# Patient Record
Sex: Male | Born: 1970 | Race: White | Hispanic: No | Marital: Married | Smoking: Current some day smoker
Health system: Southern US, Community
[De-identification: ages and names within clinical notes are randomized; demographics above are authoritative.]

## PROBLEM LIST (undated history)

## (undated) DIAGNOSIS — R51 Headache: Secondary | ICD-10-CM

## (undated) DIAGNOSIS — R079 Chest pain, unspecified: Secondary | ICD-10-CM

## (undated) DIAGNOSIS — I10 Essential (primary) hypertension: Secondary | ICD-10-CM

## (undated) DIAGNOSIS — E785 Hyperlipidemia, unspecified: Secondary | ICD-10-CM

## (undated) DIAGNOSIS — R519 Headache, unspecified: Secondary | ICD-10-CM

## (undated) DIAGNOSIS — I739 Peripheral vascular disease, unspecified: Secondary | ICD-10-CM

## (undated) HISTORY — DX: Hyperlipidemia, unspecified: E78.5

## (undated) HISTORY — PX: FINGER TENDON REPAIR: SHX1640

---

## 2015-01-24 ENCOUNTER — Ambulatory Visit (HOSPITAL_COMMUNITY)
Admission: RE | Admit: 2015-01-24 | Discharge: 2015-01-24 | Disposition: A | Discharge status: Home / Self Care | Payer: BLUE CROSS/BLUE SHIELD | Source: Ambulatory Visit | Attending: Vascular Surgery | Admitting: Vascular Surgery

## 2015-01-24 ENCOUNTER — Other Ambulatory Visit (HOSPITAL_COMMUNITY): Payer: Self-pay | Admitting: *Deleted

## 2015-01-24 DIAGNOSIS — I70212 Atherosclerosis of native arteries of extremities with intermittent claudication, left leg: Secondary | ICD-10-CM | POA: Diagnosis not present

## 2015-02-02 ENCOUNTER — Encounter: Payer: Self-pay | Admitting: Vascular Surgery

## 2015-02-07 ENCOUNTER — Other Ambulatory Visit: Payer: Self-pay

## 2015-02-07 ENCOUNTER — Ambulatory Visit (INDEPENDENT_AMBULATORY_CARE_PROVIDER_SITE_OTHER): Payer: BLUE CROSS/BLUE SHIELD | Admitting: Vascular Surgery

## 2015-02-07 ENCOUNTER — Encounter: Payer: Self-pay | Admitting: Vascular Surgery

## 2015-02-07 VITALS — BP 137/90 | HR 87 | Ht 73.0 in | Wt 241.0 lb

## 2015-02-07 DIAGNOSIS — I739 Peripheral vascular disease, unspecified: Secondary | ICD-10-CM

## 2015-02-07 NOTE — Progress Notes (Signed)
HISTORY AND PHYSICAL     CC:  Pain in left leg Referring Provider:  Harlan Stainsoe, Lori, MD  HPI: This is a 44 y.o. 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  He states that at the end of last year, he started noticing some discomfort in his left leg.  Over the past couple of months, this has progressed.  He states that he can walk about 50-60 yards when his calf starts to cramp.  This is relieved with rest.  He states that after being on his feet for a while, he gets soreness in the ball of his foot and tingling in his toes.   He does not recall any thigh or buttock claudication.  He does smoke about 1ppd of cigarettes for 25 years.  He and his wife are both wearing patches and wanting to quit smoking.  He states that he has had some chest pressure with the latest episode this morning and it lasted about 5 minutes.  He had a couple of episodes last week as well.  His dad died of MI in his 2760's.    He is on an ARB for blood pressure control.  No past medical history on file.  No past surgical history on file.  No Known Allergies  Current Outpatient Prescriptions  Medication Sig Dispense Refill  . losartan-hydrochlorothiazide (HYZAAR) 100-25 MG tablet TK 1 T PO D  11   No current facility-administered medications for this visit.    No family history on file.  Social History   Social History  . Marital Status: Unknown    Spouse Name: N/A  . Number of Children: N/A  . Years of Education: N/A   Occupational History  . Not on file.   Social History Main Topics  . Smoking status: Current Every Day Smoker    Types: Cigarettes  . Smokeless tobacco: Not on file     Comment: started today using nicotine patches, stated he has only smoked 2 cigs today  . Alcohol Use: 3.0 oz/week    5 Shots of liquor per week  . Drug Use: No  . Sexual Activity: Not on file   Other Topics Concern  . Not on file   Social History  Narrative  . No narrative on file     ROS: [x]  Positive   [ ]  Negative   [ ]  All sytems reviewed and are negative  Cardiovascular: [x]  chest pain/pressure []  palpitations []  SOB lying flat []  DOE [x]  pain in legs while walking [x]  pain in feet []  hx of DVT []  hx of phlebitis []  swelling in legs []  varicose veins  Pulmonary: []  productive cough []  asthma []  wheezing  Neurologic: []  weakness in []  arms []  legs []  numbness in []  arms []  legs [] difficulty speaking or slurred speech []  temporary loss of vision in one eye []  dizziness  Hematologic: []  bleeding problems []  problems with blood clotting easily  GI []  vomiting blood []  blood in stool  GU: []  burning with urination []  blood in urine  Psychiatric: []  hx of major depression  Integumentary: []  rashes []  ulcers  Constitutional: []  fever []  chills   PHYSICAL EXAMINATION:  Filed Vitals:   02/07/15 1536  BP: 137/90  Pulse: 87   Body mass index is 31.8 kg/(m^2).  General:  WDWN in NAD Gait: Normal HENT: WNL, normocephalic Pulmonary: normal non-labored breathing , without Rales, rhonchi,  wheezing Cardiac: RRR, without  Murmurs, rubs or gallops; with left carotid bruit Abdomen: soft, NT, no masses Skin: without rashes Vascular Exam/Pulses:  Right Left  Radial 2+ (normal) 2+ (normal)  Ulnar 2+ (normal) 2+ (normal)  Femoral 2+ (normal) 1+ (weak)  Popliteal Unable to palpate  Unable to palpate   DP 2+ (normal) absent  PT 2+ (normal) absent   Extremities: without ischemic changes, without Gangrene , without cellulitis; without open wounds;  Musculoskeletal: no muscle wasting or atrophy  Neurologic: A&O X 3; Appropriate Affect ; SENSATION: normal; MOTOR FUNCTION:  moving all extremities equally. Speech is fluent/normal   Non-Invasive Vascular Imaging:   ABI's 01/24/15: Right:  0.78 Left:  0.54  TBI'S 01/24/15: Right:  >0.70 Left:  <0.69  Pt meds includes: Statin:  No. Beta  Blocker:  No. Aspirin:  No. ACEI:  No. ARB:  Yes.   Other Antiplatelet/Anticoagulant:  No.    ASSESSMENT/PLAN:: 44 y.o. male with lifestyle limiting claudication   -pt has had progressive worsening of his left leg claudication and recent ABI's on the left are about 55%.  He does not have any non healing wounds. -will plan for aortogram with BLE runoff and possible intervention tomorrow. -He does need a cardiac evaluation given his family hx and his recent chest pressure.  If he needs surgery, he will need this workup prior to surgery.  -possible left carotid bruit-will need a carotid duplex in the near future -discussed with he and his wife the importance of quitting smoking.  The 1-800-QUIT-NOW number is given to them.   They are both wearing patches to aid them in smoking cessation.   Doreatha Massed, PA-C [facility] [phone]  Clinic MD:  Pt seen and examined in conjunction with Dr. Arbie Cookey  I have examined the patient, reviewed and agree with above. Limiting claudication. Will proceed with arteriogram and possible angioplasty. Will need cardiac evaluation and clearance if surgery is required.  Gretta Began, MD 02/07/2015 4:38 PM

## 2015-02-08 ENCOUNTER — Encounter (HOSPITAL_COMMUNITY)
Admission: RE | Disposition: A | Discharge status: Home / Self Care | Payer: Self-pay | Source: Ambulatory Visit | Attending: Vascular Surgery

## 2015-02-08 ENCOUNTER — Ambulatory Visit (HOSPITAL_COMMUNITY)
Admission: RE | Admit: 2015-02-08 | Discharge: 2015-02-08 | Disposition: A | Discharge status: Home / Self Care | Payer: BLUE CROSS/BLUE SHIELD | Source: Ambulatory Visit | Attending: Vascular Surgery | Admitting: Vascular Surgery

## 2015-02-08 ENCOUNTER — Other Ambulatory Visit: Payer: Self-pay | Admitting: Physician Assistant

## 2015-02-08 DIAGNOSIS — Z8249 Family history of ischemic heart disease and other diseases of the circulatory system: Secondary | ICD-10-CM | POA: Insufficient documentation

## 2015-02-08 DIAGNOSIS — R0789 Other chest pain: Secondary | ICD-10-CM | POA: Insufficient documentation

## 2015-02-08 DIAGNOSIS — F17210 Nicotine dependence, cigarettes, uncomplicated: Secondary | ICD-10-CM | POA: Diagnosis not present

## 2015-02-08 DIAGNOSIS — E669 Obesity, unspecified: Secondary | ICD-10-CM | POA: Diagnosis not present

## 2015-02-08 DIAGNOSIS — I70213 Atherosclerosis of native arteries of extremities with intermittent claudication, bilateral legs: Secondary | ICD-10-CM | POA: Insufficient documentation

## 2015-02-08 DIAGNOSIS — I70212 Atherosclerosis of native arteries of extremities with intermittent claudication, left leg: Secondary | ICD-10-CM | POA: Diagnosis not present

## 2015-02-08 DIAGNOSIS — I739 Peripheral vascular disease, unspecified: Secondary | ICD-10-CM

## 2015-02-08 DIAGNOSIS — Z720 Tobacco use: Secondary | ICD-10-CM

## 2015-02-08 DIAGNOSIS — Z01810 Encounter for preprocedural cardiovascular examination: Secondary | ICD-10-CM | POA: Diagnosis not present

## 2015-02-08 DIAGNOSIS — F17200 Nicotine dependence, unspecified, uncomplicated: Secondary | ICD-10-CM | POA: Insufficient documentation

## 2015-02-08 HISTORY — PX: PERIPHERAL VASCULAR CATHETERIZATION: SHX172C

## 2015-02-08 LAB — POCT I-STAT, CHEM 8
BUN: 16 mg/dL (ref 6–20)
CALCIUM ION: 1.06 mmol/L — AB (ref 1.12–1.23)
CREATININE: 0.9 mg/dL (ref 0.61–1.24)
Chloride: 98 mmol/L — ABNORMAL LOW (ref 101–111)
GLUCOSE: 95 mg/dL (ref 65–99)
HCT: 53 % — ABNORMAL HIGH (ref 39.0–52.0)
HEMOGLOBIN: 18 g/dL — AB (ref 13.0–17.0)
Potassium: 3.7 mmol/L (ref 3.5–5.1)
Sodium: 137 mmol/L (ref 135–145)
TCO2: 27 mmol/L (ref 0–100)

## 2015-02-08 SURGERY — ABDOMINAL AORTOGRAM
Anesthesia: LOCAL

## 2015-02-08 MED ORDER — LIDOCAINE HCL (PF) 1 % IJ SOLN
INTRAMUSCULAR | Status: DC | PRN
  Administered 2015-02-08: 15 mL

## 2015-02-08 MED ORDER — ACETAMINOPHEN 325 MG RE SUPP: 325.0000 mg | RECTAL | Status: DC | PRN

## 2015-02-08 MED ORDER — SODIUM CHLORIDE 0.9 % IV SOLN
INTRAVENOUS | Status: DC
  Administered 2015-02-08: 11:00:00 via INTRAVENOUS

## 2015-02-08 MED ORDER — SODIUM CHLORIDE 0.9 % IV SOLN: INTRAVENOUS | Status: DC

## 2015-02-08 MED ORDER — ACETAMINOPHEN 325 MG PO TABS: 325.0000 mg | ORAL_TABLET | ORAL | Status: DC | PRN

## 2015-02-08 MED ORDER — METOPROLOL TARTRATE 1 MG/ML IV SOLN: 2.0000 mg | INTRAVENOUS | Status: DC | PRN

## 2015-02-08 MED ORDER — HYDRALAZINE HCL 20 MG/ML IJ SOLN: 5.0000 mg | INTRAMUSCULAR | Status: DC | PRN

## 2015-02-08 MED ORDER — PHENOL 1.4 % MT LIQD: 1.0000 | OROMUCOSAL | Status: DC | PRN

## 2015-02-08 MED ORDER — SODIUM CHLORIDE 0.9 % IV SOLN: 500.0000 mL | Freq: Once | INTRAVENOUS | Status: DC | PRN

## 2015-02-08 MED ORDER — OXYCODONE HCL 5 MG PO TABS: 5.0000 mg | ORAL_TABLET | ORAL | Status: DC | PRN

## 2015-02-08 MED ORDER — ALUM & MAG HYDROXIDE-SIMETH 200-200-20 MG/5ML PO SUSP: 15.0000 mL | ORAL | Status: DC | PRN

## 2015-02-08 MED ORDER — MIDAZOLAM HCL 2 MG/2ML IJ SOLN
INTRAMUSCULAR | Status: AC
  Filled 2015-02-08: qty 4

## 2015-02-08 MED ORDER — LABETALOL HCL 5 MG/ML IV SOLN: 10.0000 mg | INTRAVENOUS | Status: DC | PRN

## 2015-02-08 MED ORDER — ONDANSETRON HCL 4 MG/2ML IJ SOLN: 4.0000 mg | Freq: Four times a day (QID) | INTRAMUSCULAR | Status: DC | PRN

## 2015-02-08 MED ORDER — PANTOPRAZOLE SODIUM 40 MG PO TBEC: 40.0000 mg | DELAYED_RELEASE_TABLET | Freq: Every day | ORAL | Status: DC

## 2015-02-08 MED ORDER — MORPHINE SULFATE (PF) 10 MG/ML IV SOLN: 2.0000 mg | INTRAVENOUS | Status: DC | PRN

## 2015-02-08 MED ORDER — HEPARIN (PORCINE) IN NACL 2-0.9 UNIT/ML-% IJ SOLN
INTRAMUSCULAR | Status: AC
  Filled 2015-02-08: qty 1000

## 2015-02-08 MED ORDER — GUAIFENESIN-DM 100-10 MG/5ML PO SYRP: 15.0000 mL | ORAL_SOLUTION | ORAL | Status: DC | PRN

## 2015-02-08 MED ORDER — LIDOCAINE HCL (PF) 1 % IJ SOLN
INTRAMUSCULAR | Status: AC
  Filled 2015-02-08: qty 30

## 2015-02-08 MED ORDER — DOCUSATE SODIUM 100 MG PO CAPS: 100.0000 mg | ORAL_CAPSULE | Freq: Every day | ORAL | Status: DC

## 2015-02-08 SURGICAL SUPPLY — 10 items
BAG SNAP BAND KOVER 36X36 (MISCELLANEOUS) ×2 IMPLANT
CATH ANGIO 5F PIGTAIL 65CM (CATHETERS) ×2 IMPLANT
COVER PRB 48X5XTLSCP FOLD TPE (BAG) ×1 IMPLANT
COVER PROBE 5X48 (BAG) ×1
KIT PV (KITS) ×2 IMPLANT
SHEATH PINNACLE 5F 10CM (SHEATH) ×2 IMPLANT
SYR MEDRAD MARK V 150ML (SYRINGE) ×2 IMPLANT
TRANSDUCER W/STOPCOCK (MISCELLANEOUS) ×2 IMPLANT
TRAY PV CATH (CUSTOM PROCEDURE TRAY) ×2 IMPLANT
WIRE HI TORQ VERSACORE-J 145CM (WIRE) ×2 IMPLANT

## 2015-02-08 NOTE — H&P (View-Only) (Signed)
HISTORY AND PHYSICAL     CC:  Pain in left leg Referring Provider:  Harlan Stainsoe, Lori, MD  HPI: This is a 44 y.o. 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  He states that at the end of last year, he started noticing some discomfort in his left leg.  Over the past couple of months, this has progressed.  He states that he can walk about 50-60 yards when his calf starts to cramp.  This is relieved with rest.  He states that after being on his feet for a while, he gets soreness in the ball of his foot and tingling in his toes.   He does not recall any thigh or buttock claudication.  He does smoke about 1ppd of cigarettes for 25 years.  He and his wife are both wearing patches and wanting to quit smoking.  He states that he has had some chest pressure with the latest episode this morning and it lasted about 5 minutes.  He had a couple of episodes last week as well.  His dad died of MI in his 2760's.    He is on an ARB for blood pressure control.  No past medical history on file.  No past surgical history on file.  No Known Allergies  Current Outpatient Prescriptions  Medication Sig Dispense Refill  . losartan-hydrochlorothiazide (HYZAAR) 100-25 MG tablet TK 1 T PO D  11   No current facility-administered medications for this visit.    No family history on file.  Social History   Social History  . Marital Status: Unknown    Spouse Name: N/A  . Number of Children: N/A  . Years of Education: N/A   Occupational History  . Not on file.   Social History Main Topics  . Smoking status: Current Every Day Smoker    Types: Cigarettes  . Smokeless tobacco: Not on file     Comment: started today using nicotine patches, stated he has only smoked 2 cigs today  . Alcohol Use: 3.0 oz/week    5 Shots of liquor per week  . Drug Use: No  . Sexual Activity: Not on file   Other Topics Concern  . Not on file   Social History  Narrative  . No narrative on file     ROS: [x]  Positive   [ ]  Negative   [ ]  All sytems reviewed and are negative  Cardiovascular: [x]  chest pain/pressure []  palpitations []  SOB lying flat []  DOE [x]  pain in legs while walking [x]  pain in feet []  hx of DVT []  hx of phlebitis []  swelling in legs []  varicose veins  Pulmonary: []  productive cough []  asthma []  wheezing  Neurologic: []  weakness in []  arms []  legs []  numbness in []  arms []  legs [] difficulty speaking or slurred speech []  temporary loss of vision in one eye []  dizziness  Hematologic: []  bleeding problems []  problems with blood clotting easily  GI []  vomiting blood []  blood in stool  GU: []  burning with urination []  blood in urine  Psychiatric: []  hx of major depression  Integumentary: []  rashes []  ulcers  Constitutional: []  fever []  chills   PHYSICAL EXAMINATION:  Filed Vitals:   02/07/15 1536  BP: 137/90  Pulse: 87   Body mass index is 31.8 kg/(m^2).  General:  WDWN in NAD Gait: Normal HENT: WNL, normocephalic Pulmonary: normal non-labored breathing , without Rales, rhonchi,  wheezing Cardiac: RRR, without  Murmurs, rubs or gallops; with left carotid bruit Abdomen: soft, NT, no masses Skin: without rashes Vascular Exam/Pulses:  Right Left  Radial 2+ (normal) 2+ (normal)  Ulnar 2+ (normal) 2+ (normal)  Femoral 2+ (normal) 1+ (weak)  Popliteal Unable to palpate  Unable to palpate   DP 2+ (normal) absent  PT 2+ (normal) absent   Extremities: without ischemic changes, without Gangrene , without cellulitis; without open wounds;  Musculoskeletal: no muscle wasting or atrophy  Neurologic: A&O X 3; Appropriate Affect ; SENSATION: normal; MOTOR FUNCTION:  moving all extremities equally. Speech is fluent/normal   Non-Invasive Vascular Imaging:   ABI's 01/24/15: Right:  0.78 Left:  0.54  TBI'S 01/24/15: Right:  >0.70 Left:  <0.69  Pt meds includes: Statin:  No. Beta  Blocker:  No. Aspirin:  No. ACEI:  No. ARB:  Yes.   Other Antiplatelet/Anticoagulant:  No.    ASSESSMENT/PLAN:: 44 y.o. male with lifestyle limiting claudication   -pt has had progressive worsening of his left leg claudication and recent ABI's on the left are about 55%.  He does not have any non healing wounds. -will plan for aortogram with BLE runoff and possible intervention tomorrow. -He does need a cardiac evaluation given his family hx and his recent chest pressure.  If he needs surgery, he will need this workup prior to surgery.  -possible left carotid bruit-will need a carotid duplex in the near future -discussed with he and his wife the importance of quitting smoking.  The 1-800-QUIT-NOW number is given to them.   They are both wearing patches to aid them in smoking cessation.   Samantha Rhyne, PA-C [facility] [phone]  Clinic MD:  Pt seen and examined in conjunction with Dr. Byan Poplaski  I have examined the patient, reviewed and agree with above. Limiting claudication. Will proceed with arteriogram and possible angioplasty. Will need cardiac evaluation and clearance if surgery is required.  Holly Iannaccone, MD 02/07/2015 4:38 PM  

## 2015-02-08 NOTE — Progress Notes (Signed)
Site area: Right groin a 5 french arterial sheath was removed  Site Prior to Removal:  Level 0  Pressure Applied For 15 MINUTES    Minutes Beginning at 1440p  Manual:   Yes.    Patient Status During Pull:  stable  Post Pull Groin Site:  Level 0  Post Pull Instructions Given:  Yes.    Post Pull Pulses Present:  Yes.    Dressing Applied:  Yes.    Comments:  VS remain stable at this time

## 2015-02-08 NOTE — Discharge Instructions (Signed)
Angiogram, Care After °Refer to this sheet in the next few weeks. These instructions provide you with information about caring for yourself after your procedure. Your health care provider may also give you more specific instructions. Your treatment has been planned according to current medical practices, but problems sometimes occur. Call your health care provider if you have any problems or questions after your procedure. °WHAT TO EXPECT AFTER THE PROCEDURE °After your procedure, it is typical to have the following: °· Bruising at the catheter insertion site that usually fades within 1-2 weeks. °· Blood collecting in the tissue (hematoma) that may be painful to the touch. It should usually decrease in size and tenderness within 1-2 weeks. °HOME CARE INSTRUCTIONS °· Take medicines only as directed by your health care provider. °· You may shower 24-48 hours after the procedure or as directed by your health care provider. Remove the bandage (dressing) and gently wash the site with plain soap and water. Pat the area dry with a clean towel. Do not rub the site, because this may cause bleeding. °· Do not take baths, swim, or use a hot tub until your health care provider approves. °· Check your insertion site every day for redness, swelling, or drainage. °· Do not apply powder or lotion to the site. °· Do not lift over 10 lb (4.5 kg) for 5 days after your procedure or as directed by your health care provider. °· Ask your health care provider when it is okay to: °¨ Return to work or school. °¨ Resume usual physical activities or sports. °¨ Resume sexual activity. °· Do not drive home if you are discharged the same day as the procedure. Have someone else drive you. °· You may drive 24 hours after the procedure unless otherwise instructed by your health care provider. °· Do not operate machinery or power tools for 24 hours after the procedure or as directed by your health care provider. °· If your procedure was done as an  outpatient procedure, which means that you went home the same day as your procedure, a responsible adult should be with you for the first 24 hours after you arrive home. °· Keep all follow-up visits as directed by your health care provider. This is important. °SEEK MEDICAL CARE IF: °· You have a fever. °· You have chills. °· You have increased bleeding from the catheter insertion site. Hold pressure on the site. Call 911 °SEEK IMMEDIATE MEDICAL CARE IF: °· You have unusual pain at the catheter insertion site. °· You have redness, warmth, or swelling at the catheter insertion site. °· You have drainage (other than a small amount of blood on the dressing) from the catheter insertion site. °· The catheter insertion site is bleeding, and the bleeding does not stop after 30 minutes of holding steady pressure on the site. °· The area near or just beyond the catheter insertion site becomes pale, cool, tingly, or numb. °  °This information is not intended to replace advice given to you by your health care provider. Make sure you discuss any questions you have with your health care provider. °  °Document Released: 10/11/2004 Document Revised: 04/15/2014 Document Reviewed: 08/26/2012 °Elsevier Interactive Patient Education ©2016 [employer] ° °

## 2015-02-08 NOTE — Interval H&P Note (Signed)
History and Physical Interval Note:  02/08/2015 1:20 PM  Cory Woodward  has presented today for surgery, with the diagnosis of pvd  The various methods of treatment have been discussed with the patient and family. After consideration of risks, benefits and other options for treatment, the patient has consented to  Procedure(s): Abdominal Aortogram (N/A) as a surgical intervention .  The patient's history has been reviewed, patient examined, no change in status, stable for surgery.  I have reviewed the patient's chart and labs.  Questions were answered to the patient's satisfaction.     Gretta BeganEarly, Reanne Nellums

## 2015-02-08 NOTE — Consult Note (Signed)
CARDIOLOGY CONSULT NOTE   Patient ID: Cory Woodward MRN: [id number] DOB/AGE: 08-14-1970 44 y.o.  Admit date: 02/08/2015  Primary Physician   Abelina Bachelor, MD Primary Cardiologist   New  Reason for Consultation   Chest pressure  HPI: Cory Woodward is a 44 y.o. male with a history of HTN and current smoker who consulted for chest pressure.  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 He states that he can walk about 50-60 yards when his calf starts to cramp. This is relieved with rest.Recent ABI's on the left are about 55%.  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 Cardiology is consulted for pre-op clearance for chest pressure. The patient starts that for the past 2-3 weeks intermittently he has been having substernal/R sided chest pressure. The pain does not associated with exertion. The relieves with in 5 minutes. No associated symptoms. He is very limited to activity. Last episode yesterday morning that woke him up from sleep. The pain resolved in few mins.   He smokes 1 1/2 pack a day for the past 20-25 years. His dad had first MI in his 69s. He said his last cholesterol level about 2-3 years ago and that was normal. The patient denies nausea, vomiting, fever, palpitations, shortness of  breath, orthopnea, PND, dizziness, syncope, cough, congestion, abdominal pain, hematochezia, melena, lower extremity edema. Currently chest pain free.    Allergies  Allergen Reactions  . Peanut-Containing Drug Products Itching    Only has reaction to raw peanuts. No reaction with cooked peanuts, peanut butter, etc    I have reviewed the patient's current medications . [START ON 02/09/2015] docusate sodium  100 mg Oral Daily  . pantoprazole  40 mg Oral Daily   . sodium chloride 100 mL/hr at 02/08/15 1124  . sodium chloride 100 mL/hr at 02/08/15 1517   sodium chloride, acetaminophen **OR** acetaminophen, alum & mag hydroxide-simeth, guaiFENesin-dextromethorphan, hydrALAZINE, labetalol, metoprolol, morphine injection, ondansetron, oxyCODONE, phenol  Prior to Admission medications   Medication Sig Start Date End Date Taking? Authorizing Provider  losartan-hydrochlorothiazide (HYZAAR) 100-25 MG tablet Take 1 tablet by mouth daily.   Yes Historical Provider, MD  nicotine (NICODERM CQ - DOSED IN MG/24 HOURS) 14 mg/24hr patch Place 14 mg onto the skin daily.   Yes Historical Provider, MD     Social History   Social History  . Marital Status: Unknown    Spouse Name: N/A  . Number of Children: N/A  . Years of Education: N/A   Occupational History  . Not on file.   Social History Main Topics  . Smoking status: Current Every Day Smoker    Types: Cigarettes  . Smokeless tobacco: Not on file     Comment: started today using nicotine patches, stated he has only smoked 2 cigs today  . Alcohol Use: 3.0 oz/week    5 Shots of liquor  per week  . Drug Use: No  . Sexual Activity: Not on file   Other Topics Concern  . Not on file   Social History Narrative  . No narrative on file    No family status information on file.   No family history on file.   ROS:  Full 14 point review of systems complete and found to be negative unless listed above.  Physical Exam: Blood pressure 132/78,  pulse 59, temperature 98 F (36.7 C), temperature source Oral, resp. rate 13, height 6\' 1"  (1.854 m), weight 243 lb (110.224 kg), SpO2 96 %.  General: Well developed, well nourished, male in no acute distress Head: Eyes PERRLA, No xanthomas. Normocephalic and atraumatic, oropharynx without edema or exudate.  Lungs: Resp regular and unlabored, CTA. Heart: RRR no s3, s4, or murmurs..   Neck: Left carotid bruits. No lymphadenopathy.  No JVD. Abdomen: Bowel sounds present, abdomen soft and non-tender without masses or hernias noted. Msk:  No spine or cva tenderness. No weakness, no joint deformities or effusions. Extremities: No clubbing, cyanosis or edema. DP/PT/Radials 2+ and equal bilaterally. R groin cath site without hematoma.  Neuro: Alert and oriented X 3. No focal deficits noted. Psych:  Good affect, responds appropriately Skin: No rashes or lesions noted.  Labs:   Lab Results  Component Value Date   HGB 18.0* 02/08/2015   HCT 53.0* 02/08/2015   No results for input(s): INR in the last 72 hours.  Recent Labs Lab 02/08/15 1118  NA 137  K 3.7  CL 98*  BUN 16  CREATININE 0.90  GLUCOSE 95    ECG:   Vent. 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 Chest pressure - His chest pain is concerning for cardiac etiology with some  atypical features. EKG showed normal sinus rhythm with TWI in lead V5 and V6. No prior EKG to compare. - He has significant risk factors  Including current smoker, family hx, HTN, PAD and carotid bruit. - Consider lipid panel for risk stratification - Will try to arrange outpatient Lexiscan Myoview ASAP. He was advised not to eat tomorrow (11/3) morning until he hears from office regarding lexiscan.   2. L carotid bruit - Will need carotid doppler at some point  3. Claudication - Progressive worsening of his left leg claudication and recent ABI's on the left are about 55% - Angiogram result as above. Plan for intervention Monday 02/13/15.   4. HTN - Stable and well controlled. - Continue home Losartan-HCTZ  5. Current tobacco smoker - Advised smoking cessation. He is using nicotine patch for the past 2 days.     SignedManson Passey: Cory Andrus, PA 02/08/2015, 4:50 PM Pager [phone]  Co-Sign MD

## 2015-02-08 NOTE — Addendum Note (Signed)
Addended by: Adria DillELDRIDGE-LEWIS, Milanie Rosenfield L on: 02/08/2015 12:41 PM   Modules accepted: Orders

## 2015-02-09 ENCOUNTER — Encounter (HOSPITAL_COMMUNITY): Payer: Self-pay | Admitting: Vascular Surgery

## 2015-02-09 ENCOUNTER — Encounter (HOSPITAL_COMMUNITY): Payer: Self-pay | Admitting: *Deleted

## 2015-02-09 ENCOUNTER — Ambulatory Visit (INDEPENDENT_AMBULATORY_CARE_PROVIDER_SITE_OTHER): Payer: BLUE CROSS/BLUE SHIELD | Admitting: Vascular Surgery

## 2015-02-09 ENCOUNTER — Telehealth: Payer: Self-pay

## 2015-02-09 ENCOUNTER — Other Ambulatory Visit: Payer: Self-pay

## 2015-02-09 VITALS — BP 153/99 | HR 75 | Ht 73.0 in | Wt 242.0 lb

## 2015-02-09 DIAGNOSIS — I739 Peripheral vascular disease, unspecified: Secondary | ICD-10-CM

## 2015-02-09 NOTE — Progress Notes (Signed)
I spoke with Dr Massage and read to him an additional note from Dr Anne FuSkains, "obviously if leg or foot becomes ischemic, may need to proceed to surgery emergently."  Dr Massage instructed me to have patient to come to the hospital at 8:00AM

## 2015-02-09 NOTE — Telephone Encounter (Signed)
Phone call from pt.  Reported increased pain in left calf and foot this AM, after being up for approx. 30 min.  Reported there is a constant tingling and numbness in the left foot and calf, and noticeable cooler temperature to the left foot.  Stated the nailbeds of the left foot are slightly blue, in comparison to toes of right foot.  Also reported the left leg feels better when hanging down to the floor.  Discussed with [name].  Advised to bring pt. In today to be evaluated by Dr. Darrick PennaFields.  Pt. advised to stay NPO, and come in to office now, to be worked in for further evaluation of left leg.  Agreed with plan.

## 2015-02-09 NOTE — Progress Notes (Signed)
Anesthesia Chart Review: SAME DAY WORK-UP.   [name] notified [name] about this patient/history after 5pm, as he was a late add-on from [facility].   Patient is a 44 year old male scheduled for left FPBG tomorrow by [name]. Case is currently posted for ~ 12:45 PM.  History includes smoking, HTN, PVD, chest pain. Left carotid bruits (with plans for carotid duplex at some point per [facility]). BMI is 32.  PCP is listed as [name].  Meds include Nicoderm and Hyzaar.  He underwent aortogram with BLE run-off on 02/08/15 was found to have occlusion of left SFA with reconstitution of the AK popliteal artery with some thrombus in the AK popliteal artery with left leg runoff via the peroneal artery awith mid calf occlusion of the AT and PT arteries. [facility] was consulted that same day due to chest pain and need for pre-operative clearance. He was seen by [name], PA-C and [name]. [name]: Given his PVD, smoking history, father MI at young age, occasional chest pain, NSSTW changes on ECG, we will proceed with NUC stress test, Lexiscan, prior to further vascular procedure.  Would recommend statin therapy (would check Lipids as outpatient) Carotid bruit - would recommend vascular carotid u/s as outpatient per [name].  Obviously if leg or foot becomes ischemic, may need to proceed to surgery emergently.   Unfortunately, since yesterday, patient's symptoms progressed from claudication to rest pain and now with left foot coolness. He was seen as an add-on by [name] today. Per [name] note, "Plan: the patient had originally been scheduled for a cardiac evaluation early next week. However due to his progression of symptoms. I believe he is at risk of limb loss without urgent bypass. We have scheduled this for [name] for tomorrow. The patient has previously had the procedure details risks benefits possible complications discussed with him by [name]. I  did discuss with the patient today that there is some cardiac risk involved with any lower extremity vascular procedure he understands and agrees to proceed."  [name] has requested that patient arrive early tomorrow at 7:30 AM, to allow time to sort out what conversations have taken place between cardiology and vascular surgery (since case was posted) and determine if any urgent cardiac testing is needed prior to surgery.  Cory Ochsllison Alvera Tourigny, PA-C [facility] Phone [phone] 02/09/2015 6:46 PM

## 2015-02-09 NOTE — Progress Notes (Signed)
I spoke with Dr Bea LauraE FItzgerald reguarding Dr Anne FuSkains notes from 02/08/15 regarding Stress test recommended.  Dr Sampson GoonFitzgerald instructed  me to have patient come in early.

## 2015-02-09 NOTE — Progress Notes (Signed)
Patient is a 44 year old male who presented for follow-up today Re: Left leg ischemic symptoms. He underwent a left lower extremity arteriogram by [name] yesterday. This was an uneventful diagnostic procedure. The patient was noted to have a possible profunda stenosis with patent inflow and a chronic left superficial femoral artery occlusion a patent above-knee popliteal arterywith one-vessel runoff by the peroneal artery.   The patient had been primarily experiencing claudication symptoms. However, over the last 24 hours this has progressed to rest pain and coolness in the left foot.   The pain is improved somewhat when he sits and dangles the foot.   Physical exam: Filed Vitals:   02/09/15 1157 02/09/15 1159  BP: 160/100 153/99  Pulse: 75   Height: 6\' 1"  (1.854 m)   Weight: 242 lb (109.77 kg)   SpO2: 95%     Neck left-sided carotid bruit no right bruit  chest: Clear to auscultation bilaterally.  Cardiac: Regular rate and rhythm without murmur  abdomen: Soft nontender nondistended  , left groin puncture site without hematoma mildly tender,    vascular:Triphasic Doppler flow at the femoral level , monophasic Doppler flow at the popliteal level , no audible Doppler flow in the left foot,  Left foot toes are dusky compared to the right. There is delayed capillary refill compared to the right ,  Motor and sensation currently are intact although the patient stated that the left foot was more numb earlier   Assessment: patient with threatened left limb due to progressive chronic ischemia  Plan: the patient had originally been scheduled for a cardiac evaluation early next week. However due to his progression of symptoms. I believe he is at risk of limb loss without urgent bypass. We have scheduled this for [name] for tomorrow. The patient has previously had the procedure details risks benefits possible complications discussed with him by [name]. I did discuss with the patient today that there  is some cardiac risk involved with any lower extremity vascular procedure he understands and agrees to proceed.  Needs carotid duplex at some point to eval left side bruit  Fabienne Brunsharles Aarin Bluett, MD [facility] Office: [phone] Pager: [phone]

## 2015-02-10 ENCOUNTER — Inpatient Hospital Stay (HOSPITAL_COMMUNITY): Payer: BLUE CROSS/BLUE SHIELD | Admitting: Vascular Surgery

## 2015-02-10 ENCOUNTER — Inpatient Hospital Stay (HOSPITAL_COMMUNITY): Payer: BLUE CROSS/BLUE SHIELD

## 2015-02-10 ENCOUNTER — Inpatient Hospital Stay (HOSPITAL_COMMUNITY)
Admission: RE | Admit: 2015-02-10 | Discharge: 2015-02-12 | DRG: 271 | Disposition: A | Discharge status: Home / Self Care | Payer: BLUE CROSS/BLUE SHIELD | Source: Ambulatory Visit | Attending: Vascular Surgery | Admitting: Vascular Surgery

## 2015-02-10 ENCOUNTER — Encounter (HOSPITAL_COMMUNITY): Payer: Self-pay | Admitting: *Deleted

## 2015-02-10 ENCOUNTER — Encounter (HOSPITAL_COMMUNITY)
Admission: RE | Disposition: A | Discharge status: Home / Self Care | Payer: Self-pay | Source: Ambulatory Visit | Attending: Vascular Surgery

## 2015-02-10 DIAGNOSIS — J449 Chronic obstructive pulmonary disease, unspecified: Secondary | ICD-10-CM | POA: Diagnosis present

## 2015-02-10 DIAGNOSIS — I743 Embolism and thrombosis of arteries of the lower extremities: Secondary | ICD-10-CM | POA: Diagnosis present

## 2015-02-10 DIAGNOSIS — Z6832 Body mass index (BMI) 32.0-32.9, adult: Secondary | ICD-10-CM

## 2015-02-10 DIAGNOSIS — I10 Essential (primary) hypertension: Secondary | ICD-10-CM | POA: Diagnosis present

## 2015-02-10 DIAGNOSIS — I739 Peripheral vascular disease, unspecified: Principal | ICD-10-CM | POA: Diagnosis present

## 2015-02-10 DIAGNOSIS — Z9889 Other specified postprocedural states: Secondary | ICD-10-CM

## 2015-02-10 DIAGNOSIS — F17210 Nicotine dependence, cigarettes, uncomplicated: Secondary | ICD-10-CM | POA: Diagnosis present

## 2015-02-10 DIAGNOSIS — Z419 Encounter for procedure for purposes other than remedying health state, unspecified: Secondary | ICD-10-CM

## 2015-02-10 DIAGNOSIS — I998 Other disorder of circulatory system: Secondary | ICD-10-CM | POA: Diagnosis not present

## 2015-02-10 HISTORY — DX: Headache, unspecified: R51.9

## 2015-02-10 HISTORY — PX: EMBOLECTOMY: SHX44

## 2015-02-10 HISTORY — DX: Essential (primary) hypertension: I10

## 2015-02-10 HISTORY — DX: Peripheral vascular disease, unspecified: I73.9

## 2015-02-10 HISTORY — DX: Headache: R51

## 2015-02-10 HISTORY — DX: Chest pain, unspecified: R07.9

## 2015-02-10 HISTORY — PX: ARTERY EXPLORATION: SHX5110

## 2015-02-10 HISTORY — PX: INTRAOPERATIVE ARTERIOGRAM: SHX5157

## 2015-02-10 HISTORY — PX: FEMORAL-POPLITEAL BYPASS GRAFT: SHX937

## 2015-02-10 LAB — URINALYSIS, ROUTINE W REFLEX MICROSCOPIC
Bilirubin Urine: NEGATIVE
Glucose, UA: NEGATIVE mg/dL
Hgb urine dipstick: NEGATIVE
Ketones, ur: NEGATIVE mg/dL
LEUKOCYTES UA: NEGATIVE
Nitrite: NEGATIVE
PROTEIN: NEGATIVE mg/dL
Specific Gravity, Urine: 1.023 (ref 1.005–1.030)
UROBILINOGEN UA: 1 mg/dL (ref 0.0–1.0)
pH: 6 (ref 5.0–8.0)

## 2015-02-10 LAB — COMPREHENSIVE METABOLIC PANEL
ALBUMIN: 4 g/dL (ref 3.5–5.0)
ALK PHOS: 48 U/L (ref 38–126)
ALT: 48 U/L (ref 17–63)
AST: 23 U/L (ref 15–41)
Anion gap: 13 (ref 5–15)
BUN: 14 mg/dL (ref 6–20)
CALCIUM: 9.2 mg/dL (ref 8.9–10.3)
CO2: 24 mmol/L (ref 22–32)
CREATININE: 1.05 mg/dL (ref 0.61–1.24)
Chloride: 99 mmol/L — ABNORMAL LOW (ref 101–111)
GFR calc non Af Amer: >60 mL/min (ref >60)
Glucose, Bld: 100 mg/dL — ABNORMAL HIGH (ref 65–99)
Potassium: 3.6 mmol/L (ref 3.5–5.1)
Sodium: 136 mmol/L (ref 135–145)
Total Bilirubin: 0.9 mg/dL (ref 0.3–1.2)
Total Protein: 6.5 g/dL (ref 6.5–8.1)

## 2015-02-10 LAB — ABO/RH: ABO/RH(D): A POS

## 2015-02-10 LAB — CBC
HEMATOCRIT: 47.4 % (ref 39.0–52.0)
HEMOGLOBIN: 16 g/dL (ref 13.0–17.0)
MCH: 30.6 pg (ref 26.0–34.0)
MCHC: 33.8 g/dL (ref 30.0–36.0)
MCV: 90.6 fL (ref 78.0–100.0)
Platelets: 201 10*3/uL (ref 150–400)
RBC: 5.23 MIL/uL (ref 4.22–5.81)
RDW: 12.3 % (ref 11.5–15.5)
WBC: 12 10*3/uL — ABNORMAL HIGH (ref 4.0–10.5)

## 2015-02-10 LAB — TYPE AND SCREEN
ABO/RH(D): A POS
Antibody Screen: NEGATIVE

## 2015-02-10 LAB — SURGICAL PCR SCREEN
MRSA, PCR: NEGATIVE
Staphylococcus aureus: NEGATIVE

## 2015-02-10 LAB — PROTIME-INR
INR: 1.07 (ref 0.00–1.49)
Prothrombin Time: 14.1 seconds (ref 11.6–15.2)

## 2015-02-10 LAB — APTT: aPTT: 29 seconds (ref 24–37)

## 2015-02-10 SURGERY — BYPASS GRAFT FEMORAL-POPLITEAL ARTERY
Anesthesia: General | Site: Leg Upper | Laterality: Left

## 2015-02-10 MED ORDER — HYDROMORPHONE HCL 1 MG/ML IJ SOLN
0.5000 mg | INTRAMUSCULAR | Status: DC | PRN
  Administered 2015-02-10 (×2): 0.5 mg via INTRAVENOUS

## 2015-02-10 MED ORDER — POTASSIUM CHLORIDE CRYS ER 20 MEQ PO TBCR
20.0000 meq | EXTENDED_RELEASE_TABLET | Freq: Every day | ORAL | Status: AC | PRN
  Administered 2015-02-11: 20 meq via ORAL
  Filled 2015-02-10: qty 1

## 2015-02-10 MED ORDER — LOSARTAN POTASSIUM 50 MG PO TABS
100.0000 mg | ORAL_TABLET | Freq: Every day | ORAL | Status: DC
  Administered 2015-02-11 – 2015-02-12 (×2): 100 mg via ORAL
  Filled 2015-02-10 (×3): qty 2

## 2015-02-10 MED ORDER — ONDANSETRON HCL 4 MG/2ML IJ SOLN
INTRAMUSCULAR | Status: AC
  Filled 2015-02-10: qty 2

## 2015-02-10 MED ORDER — LOSARTAN POTASSIUM-HCTZ 100-25 MG PO TABS: 1.0000 | ORAL_TABLET | Freq: Every day | ORAL | Status: DC

## 2015-02-10 MED ORDER — PHENYLEPHRINE 40 MCG/ML (10ML) SYRINGE FOR IV PUSH (FOR BLOOD PRESSURE SUPPORT)
PREFILLED_SYRINGE | INTRAVENOUS | Status: AC
  Filled 2015-02-10: qty 10

## 2015-02-10 MED ORDER — FENTANYL CITRATE (PF) 100 MCG/2ML IJ SOLN
INTRAMUSCULAR | Status: DC | PRN
  Administered 2015-02-10: 150 ug via INTRAVENOUS
  Administered 2015-02-10: 50 ug via INTRAVENOUS
  Administered 2015-02-10: 100 ug via INTRAVENOUS
  Administered 2015-02-10 (×2): 50 ug via INTRAVENOUS

## 2015-02-10 MED ORDER — SODIUM CHLORIDE 0.9 % IV SOLN
INTRAVENOUS | Status: DC
  Administered 2015-02-10: 125 mL/h via INTRAVENOUS

## 2015-02-10 MED ORDER — VECURONIUM BROMIDE 10 MG IV SOLR
INTRAVENOUS | Status: DC | PRN
  Administered 2015-02-10 (×2): 2 mg via INTRAVENOUS

## 2015-02-10 MED ORDER — FENTANYL CITRATE (PF) 250 MCG/5ML IJ SOLN
INTRAMUSCULAR | Status: AC
  Filled 2015-02-10: qty 5

## 2015-02-10 MED ORDER — CHLORHEXIDINE GLUCONATE CLOTH 2 % EX PADS: 6.0000 | MEDICATED_PAD | Freq: Once | CUTANEOUS | Status: DC

## 2015-02-10 MED ORDER — ROCURONIUM BROMIDE 50 MG/5ML IV SOLN
INTRAVENOUS | Status: AC
  Filled 2015-02-10: qty 1

## 2015-02-10 MED ORDER — IOHEXOL 300 MG/ML  SOLN
INTRAMUSCULAR | Status: DC | PRN
  Administered 2015-02-10: 20 mL via INTRAVENOUS

## 2015-02-10 MED ORDER — HYDROMORPHONE HCL 1 MG/ML IJ SOLN
INTRAMUSCULAR | Status: AC
  Filled 2015-02-10: qty 1

## 2015-02-10 MED ORDER — LIDOCAINE HCL (CARDIAC) 20 MG/ML IV SOLN
INTRAVENOUS | Status: AC
  Filled 2015-02-10: qty 5

## 2015-02-10 MED ORDER — GUAIFENESIN-DM 100-10 MG/5ML PO SYRP: 15.0000 mL | ORAL_SOLUTION | ORAL | Status: DC | PRN

## 2015-02-10 MED ORDER — HEPARIN SODIUM (PORCINE) 1000 UNIT/ML IJ SOLN
INTRAMUSCULAR | Status: AC
  Filled 2015-02-10: qty 1

## 2015-02-10 MED ORDER — LACTATED RINGERS IV SOLN
INTRAVENOUS | Status: DC
  Administered 2015-02-10 (×3): via INTRAVENOUS

## 2015-02-10 MED ORDER — MUPIROCIN 2 % EX OINT
1.0000 "application " | TOPICAL_OINTMENT | Freq: Once | CUTANEOUS | Status: DC
  Filled 2015-02-10: qty 22

## 2015-02-10 MED ORDER — HYDROMORPHONE HCL 1 MG/ML IJ SOLN
0.2500 mg | INTRAMUSCULAR | Status: DC | PRN
  Administered 2015-02-10 (×4): 0.5 mg via INTRAVENOUS

## 2015-02-10 MED ORDER — MAGNESIUM SULFATE 2 GM/50ML IV SOLN
2.0000 g | Freq: Every day | INTRAVENOUS | Status: DC | PRN
  Filled 2015-02-10: qty 50

## 2015-02-10 MED ORDER — DEXAMETHASONE SODIUM PHOSPHATE 4 MG/ML IJ SOLN
INTRAMUSCULAR | Status: AC
  Filled 2015-02-10: qty 1

## 2015-02-10 MED ORDER — MIDAZOLAM HCL 2 MG/2ML IJ SOLN
INTRAMUSCULAR | Status: AC
  Filled 2015-02-10: qty 4

## 2015-02-10 MED ORDER — ROCURONIUM BROMIDE 100 MG/10ML IV SOLN
INTRAVENOUS | Status: DC | PRN
Start: 2015-02-10 — End: 2015-02-10
  Administered 2015-02-10: 50 mg via INTRAVENOUS

## 2015-02-10 MED ORDER — SENNOSIDES-DOCUSATE SODIUM 8.6-50 MG PO TABS: 1.0000 | ORAL_TABLET | Freq: Every evening | ORAL | Status: DC | PRN

## 2015-02-10 MED ORDER — DEXTROSE 5 % IV SOLN
1.5000 g | INTRAVENOUS | Status: AC
  Administered 2015-02-10 (×2): 1.5 g via INTRAVENOUS
  Filled 2015-02-10: qty 1.5

## 2015-02-10 MED ORDER — MIDAZOLAM HCL 2 MG/2ML IJ SOLN: 0.5000 mg | Freq: Once | INTRAMUSCULAR | Status: DC | PRN

## 2015-02-10 MED ORDER — NEOSTIGMINE METHYLSULFATE 10 MG/10ML IV SOLN
INTRAVENOUS | Status: DC | PRN
  Administered 2015-02-10: 4 mg via INTRAVENOUS

## 2015-02-10 MED ORDER — ASPIRIN 81 MG PO CHEW
81.0000 mg | CHEWABLE_TABLET | Freq: Every day | ORAL | Status: DC
  Administered 2015-02-11 – 2015-02-12 (×2): 81 mg via ORAL
  Filled 2015-02-10 (×2): qty 1

## 2015-02-10 MED ORDER — ONDANSETRON HCL 4 MG/2ML IJ SOLN
4.0000 mg | Freq: Four times a day (QID) | INTRAMUSCULAR | Status: DC | PRN
  Administered 2015-02-10: 4 mg via INTRAVENOUS
  Filled 2015-02-10: qty 2

## 2015-02-10 MED ORDER — 0.9 % SODIUM CHLORIDE (POUR BTL) OPTIME
TOPICAL | Status: DC | PRN
  Administered 2015-02-10 (×2): 1000 mL

## 2015-02-10 MED ORDER — GLYCOPYRROLATE 0.2 MG/ML IJ SOLN
INTRAMUSCULAR | Status: AC
  Filled 2015-02-10: qty 3

## 2015-02-10 MED ORDER — PROTAMINE SULFATE 10 MG/ML IV SOLN
INTRAVENOUS | Status: AC
  Filled 2015-02-10: qty 5

## 2015-02-10 MED ORDER — DEXTROSE 5 % IV SOLN
1.5000 g | INTRAVENOUS | Status: AC
  Filled 2015-02-10: qty 1.5

## 2015-02-10 MED ORDER — PROPOFOL 10 MG/ML IV BOLUS
INTRAVENOUS | Status: AC
  Filled 2015-02-10: qty 20

## 2015-02-10 MED ORDER — BISACODYL 5 MG PO TBEC: 5.0000 mg | DELAYED_RELEASE_TABLET | Freq: Every day | ORAL | Status: DC | PRN

## 2015-02-10 MED ORDER — GLYCOPYRROLATE 0.2 MG/ML IJ SOLN
INTRAMUSCULAR | Status: DC | PRN
  Administered 2015-02-10: .5 mg via INTRAVENOUS

## 2015-02-10 MED ORDER — PHENOL 1.4 % MT LIQD: 1.0000 | OROMUCOSAL | Status: DC | PRN

## 2015-02-10 MED ORDER — NEOSTIGMINE METHYLSULFATE 10 MG/10ML IV SOLN
INTRAVENOUS | Status: AC
  Filled 2015-02-10: qty 1

## 2015-02-10 MED ORDER — EPHEDRINE SULFATE 50 MG/ML IJ SOLN
INTRAMUSCULAR | Status: DC | PRN
  Administered 2015-02-10 (×3): 5 mg via INTRAVENOUS

## 2015-02-10 MED ORDER — HYDROCHLOROTHIAZIDE 25 MG PO TABS
25.0000 mg | ORAL_TABLET | Freq: Every day | ORAL | Status: DC
  Administered 2015-02-11 – 2015-02-12 (×2): 25 mg via ORAL
  Filled 2015-02-10 (×3): qty 1

## 2015-02-10 MED ORDER — ENOXAPARIN SODIUM 40 MG/0.4ML ~~LOC~~ SOLN
40.0000 mg | SUBCUTANEOUS | Status: DC
  Administered 2015-02-11 – 2015-02-12 (×2): 40 mg via SUBCUTANEOUS
  Filled 2015-02-10 (×3): qty 0.4

## 2015-02-10 MED ORDER — ACETAMINOPHEN 650 MG RE SUPP: 325.0000 mg | RECTAL | Status: DC | PRN

## 2015-02-10 MED ORDER — SODIUM CHLORIDE 0.9 % IV SOLN: INTRAVENOUS | Status: DC

## 2015-02-10 MED ORDER — HEPARIN SODIUM (PORCINE) 1000 UNIT/ML IJ SOLN
INTRAMUSCULAR | Status: DC | PRN
  Administered 2015-02-10: 4000 [IU] via INTRAVENOUS
  Administered 2015-02-10: 10000 [IU] via INTRAVENOUS

## 2015-02-10 MED ORDER — PANTOPRAZOLE SODIUM 40 MG PO TBEC
40.0000 mg | DELAYED_RELEASE_TABLET | Freq: Every day | ORAL | Status: DC
  Administered 2015-02-11 – 2015-02-12 (×2): 40 mg via ORAL
  Filled 2015-02-10 (×2): qty 1

## 2015-02-10 MED ORDER — HYDRALAZINE HCL 20 MG/ML IJ SOLN: 5.0000 mg | INTRAMUSCULAR | Status: DC | PRN

## 2015-02-10 MED ORDER — MORPHINE SULFATE (PF) 2 MG/ML IV SOLN
2.0000 mg | INTRAVENOUS | Status: DC | PRN
  Administered 2015-02-10 – 2015-02-11 (×4): 2 mg via INTRAVENOUS
  Filled 2015-02-10: qty 2

## 2015-02-10 MED ORDER — PROMETHAZINE HCL 25 MG/ML IJ SOLN: 6.2500 mg | INTRAMUSCULAR | Status: DC | PRN

## 2015-02-10 MED ORDER — SODIUM CHLORIDE 0.9 % IV SOLN: 500.0000 mL | Freq: Once | INTRAVENOUS | Status: DC | PRN

## 2015-02-10 MED ORDER — SODIUM CHLORIDE 0.9 % IV SOLN
INTRAVENOUS | Status: DC | PRN
  Administered 2015-02-10: 500 mL

## 2015-02-10 MED ORDER — ONDANSETRON HCL 4 MG/2ML IJ SOLN
INTRAMUSCULAR | Status: DC | PRN
  Administered 2015-02-10: 4 mg via INTRAVENOUS

## 2015-02-10 MED ORDER — NICOTINE 14 MG/24HR TD PT24
14.0000 mg | MEDICATED_PATCH | Freq: Every day | TRANSDERMAL | Status: DC
  Administered 2015-02-11: 14 mg via TRANSDERMAL
  Filled 2015-02-10 (×2): qty 1

## 2015-02-10 MED ORDER — PROPOFOL 10 MG/ML IV BOLUS
INTRAVENOUS | Status: DC | PRN
Start: 2015-02-10 — End: 2015-02-10
  Administered 2015-02-10: 20 mg via INTRAVENOUS
  Administered 2015-02-10: 200 mg via INTRAVENOUS
  Administered 2015-02-10: 30 mg via INTRAVENOUS

## 2015-02-10 MED ORDER — LIDOCAINE HCL (CARDIAC) 20 MG/ML IV SOLN
INTRAVENOUS | Status: DC | PRN
  Administered 2015-02-10: 20 mg via INTRAVENOUS

## 2015-02-10 MED ORDER — MIDAZOLAM HCL 5 MG/5ML IJ SOLN
INTRAMUSCULAR | Status: DC | PRN
  Administered 2015-02-10: 2 mg via INTRAVENOUS

## 2015-02-10 MED ORDER — DOCUSATE SODIUM 100 MG PO CAPS
100.0000 mg | ORAL_CAPSULE | Freq: Every day | ORAL | Status: DC
  Administered 2015-02-11 – 2015-02-12 (×2): 100 mg via ORAL
  Filled 2015-02-10 (×2): qty 1

## 2015-02-10 MED ORDER — MEPERIDINE HCL 25 MG/ML IJ SOLN: 6.2500 mg | INTRAMUSCULAR | Status: DC | PRN

## 2015-02-10 MED ORDER — ACETAMINOPHEN 325 MG PO TABS: 325.0000 mg | ORAL_TABLET | ORAL | Status: DC | PRN

## 2015-02-10 MED ORDER — LABETALOL HCL 5 MG/ML IV SOLN: 10.0000 mg | INTRAVENOUS | Status: DC | PRN

## 2015-02-10 MED ORDER — METOPROLOL TARTRATE 1 MG/ML IV SOLN: 2.0000 mg | INTRAVENOUS | Status: DC | PRN

## 2015-02-10 MED ORDER — OXYCODONE-ACETAMINOPHEN 5-325 MG PO TABS
1.0000 | ORAL_TABLET | ORAL | Status: DC | PRN
  Administered 2015-02-11: 2 via ORAL
  Administered 2015-02-11: 1 via ORAL
  Administered 2015-02-11: 2 via ORAL
  Administered 2015-02-11 – 2015-02-12 (×3): 1 via ORAL
  Filled 2015-02-10: qty 1
  Filled 2015-02-10 (×2): qty 2
  Filled 2015-02-10 (×3): qty 1

## 2015-02-10 MED ORDER — ALUM & MAG HYDROXIDE-SIMETH 200-200-20 MG/5ML PO SUSP: 15.0000 mL | ORAL | Status: DC | PRN

## 2015-02-10 SURGICAL SUPPLY — 59 items
BANDAGE ESMARK 6X9 LF (GAUZE/BANDAGES/DRESSINGS) IMPLANT
BENZOIN TINCTURE PRP APPL 2/3 (GAUZE/BANDAGES/DRESSINGS) ×9 IMPLANT
BNDG ESMARK 6X9 LF (GAUZE/BANDAGES/DRESSINGS)
CANISTER SUCTION 2500CC (MISCELLANEOUS) ×3 IMPLANT
CANNULA VESSEL 3MM 2 BLNT TIP (CANNULA) ×6 IMPLANT
CATH EMB 3FR 80CM (CATHETERS) ×3 IMPLANT
CATH EMB 4FR 80CM (CATHETERS) ×3 IMPLANT
CLIP LIGATING EXTRA MED SLVR (CLIP) ×3 IMPLANT
CLIP LIGATING EXTRA SM BLUE (MISCELLANEOUS) ×3 IMPLANT
COUNTER NEEDLE 20 DBL MAG RED (NEEDLE) IMPLANT
CUFF TOURNIQUET SINGLE 34IN LL (TOURNIQUET CUFF) IMPLANT
CUFF TOURNIQUET SINGLE 44IN (TOURNIQUET CUFF) IMPLANT
DRAIN SNY 10X20 3/4 PERF (WOUND CARE) IMPLANT
DRAPE PROXIMA HALF (DRAPES) IMPLANT
DRAPE X-RAY CASS 24X20 (DRAPES) ×3 IMPLANT
DRSG COVADERM 4X10 (GAUZE/BANDAGES/DRESSINGS) IMPLANT
DRSG COVADERM 4X6 (GAUZE/BANDAGES/DRESSINGS) ×3 IMPLANT
DRSG COVADERM 4X8 (GAUZE/BANDAGES/DRESSINGS) ×9 IMPLANT
ELECT REM PT RETURN 9FT ADLT (ELECTROSURGICAL) ×3
ELECTRODE REM PT RTRN 9FT ADLT (ELECTROSURGICAL) ×2 IMPLANT
EVACUATOR SILICONE 100CC (DRAIN) IMPLANT
GAUZE SPONGE 4X4 12PLY STRL (GAUZE/BANDAGES/DRESSINGS) IMPLANT
GLOVE BIO SURGEON STRL SZ7.5 (GLOVE) ×3 IMPLANT
GLOVE BIOGEL PI IND STRL 8 (GLOVE) ×2 IMPLANT
GLOVE BIOGEL PI INDICATOR 8 (GLOVE) ×1
GLOVE ECLIPSE 7.5 STRL STRAW (GLOVE) ×9 IMPLANT
GLOVE SS BIOGEL STRL SZ 7.5 (GLOVE) ×2 IMPLANT
GLOVE SUPERSENSE BIOGEL SZ 7.5 (GLOVE) ×1
GOWN STRL REUS W/ TWL LRG LVL3 (GOWN DISPOSABLE) ×4 IMPLANT
GOWN STRL REUS W/ TWL XL LVL3 (GOWN DISPOSABLE) ×6 IMPLANT
GOWN STRL REUS W/TWL LRG LVL3 (GOWN DISPOSABLE) ×2
GOWN STRL REUS W/TWL XL LVL3 (GOWN DISPOSABLE) ×3
INSERT FOGARTY SM (MISCELLANEOUS) IMPLANT
KIT BASIN OR (CUSTOM PROCEDURE TRAY) ×3 IMPLANT
KIT ROOM TURNOVER OR (KITS) ×3 IMPLANT
NS IRRIG 1000ML POUR BTL (IV SOLUTION) ×6 IMPLANT
PACK PERIPHERAL VASCULAR (CUSTOM PROCEDURE TRAY) ×3 IMPLANT
PAD ARMBOARD 7.5X6 YLW CONV (MISCELLANEOUS) ×6 IMPLANT
PADDING CAST COTTON 6X4 STRL (CAST SUPPLIES) IMPLANT
SET COLLECT BLD 21X3/4 12 (NEEDLE) IMPLANT
SET COLLECT BLD 21X3/4 12 PB (MISCELLANEOUS) ×3 IMPLANT
STAPLER VISISTAT 35W (STAPLE) IMPLANT
STOPCOCK 4 WAY LG BORE MALE ST (IV SETS) ×3 IMPLANT
STRIP CLOSURE SKIN 1/2X4 (GAUZE/BANDAGES/DRESSINGS) ×9 IMPLANT
SUT ETHILON 3 0 PS 1 (SUTURE) IMPLANT
SUT PROLENE 5 0 C 1 24 (SUTURE) ×6 IMPLANT
SUT PROLENE 6 0 CC (SUTURE) ×21 IMPLANT
SUT SILK 2 0 FS (SUTURE) ×3 IMPLANT
SUT SILK 2 0 SH (SUTURE) ×3 IMPLANT
SUT SILK 3 0 (SUTURE) ×1
SUT SILK 3-0 18XBRD TIE 12 (SUTURE) ×2 IMPLANT
SUT VIC AB 2-0 CTX 36 (SUTURE) ×6 IMPLANT
SUT VIC AB 3-0 SH 27 (SUTURE) ×4
SUT VIC AB 3-0 SH 27X BRD (SUTURE) ×8 IMPLANT
SYRINGE 3CC LL L/F (MISCELLANEOUS) ×3 IMPLANT
TRAY FOLEY W/METER SILVER 16FR (SET/KITS/TRAYS/PACK) ×3 IMPLANT
TUBING EXTENTION W/L.L. (IV SETS) ×3 IMPLANT
UNDERPAD 30X30 INCONTINENT (UNDERPADS AND DIAPERS) ×3 IMPLANT
WATER STERILE IRR 1000ML POUR (IV SOLUTION) ×3 IMPLANT

## 2015-02-10 NOTE — Op Note (Signed)
OPERATIVE REPORT  DATE OF SURGERY: 02/10/2015  PATIENT: Cory Woodward, 44 y.o. male MRN: [id number]  DOB: 04-29-1970  PRE-OPERATIVE DIAGNOSIS: Critical limb ischemia left foot  POST-OPERATIVE DIAGNOSIS:  Same  PROCEDURE: Thrombectomy of left superficial femoral artery, left femoral to above-knee popliteal bypass with translocated non-reversed great saphenous vein, tibial exploration with posterior tibial and anterior tibial and posterior tibial thrombectomy  SURGEON:  Curt Jews, M.D.  PHYSICIAN ASSISTANT: Collins  ANESTHESIA:  Gen.  EBL: 200 ml  Total I/O In: 2500 [I.V.:2500] Out: 460 [Urine:260; Blood:200]  BLOOD ADMINISTERED: None  DRAINS: None  SPECIMEN: None  COUNTS CORRECT:  YES  PLAN OF CARE: PACU   PATIENT DISPOSITION:  PACU - hemodynamically stable  PROCEDURE DETAILS: Patient's 44 year old gentleman who presented 3 days earlier withSevere limiting left calf claudication. He underwent formal arteriogram on 02/08/2015. This revealed occlusion of the superficial femoral artery at its origin with some thrombus in the distal superficial femoral artery. He had a widely patent normal appearing popliteal artery and severe tibial disease with runoff only via his peroneal artery which was diseased with poor collateralization into the foot. He was also concerned have some symptoms potentially suggestive of cardiac disease. He was scheduled for surgery on 11 7 with urgent cardiac clearance of possible. The patient called the office on the morning of 02/09/2015 with new onset of pain at rest and some tingling and numbness in his left foot. He was seen in the office and was felt to have a change in his level of ischemia and was scheduled for surgery today. I risk including potential cardiac risk and graft failure potential amputation were discussed with patient and his wife.  Patient was taken to the operative placed supine position where the area of the left groin left leg  and left foot were prepped and draped in usual sterile fashion. An incision was made over the femoral pulse and carried down to isolate the common superficial femoral and profundus femoris arteries. Patient had 2 large profundus femoris branches and these were circled with Vesseloops. The saphenous vein was identified at the saphenofemoral junction and the was tributary branches ligated and divided. A separate the incision was made in the mid thigh leaving a skin bridge and then incision to be able to approach the above-knee popliteal artery was made as well for vein harvest. The vein was excellent caliber throughout its course. Tributary branches of the vein were ligated with 301 4-0 silk ties and divided. The above-knee popliteal artery was exposed and was soft with minimal plaque. The vein was occluded at the saphenofemoral junction with a Cooley clamp and the vein was divided at the saphenofemoral junction and the saphenofemoral junction was oversewn with a 50 proline suture. The vein was also ligated the level of his knee. The vein was gently dilated was excellent caliber. The vein was marked to prevent twisting while tunneling. Patient was given 10,000 units intravenous heparin after placement of a tunnel from the level of the popliteal to the groin. After adequate circulation time the common femoral artery was occluded with a Henley clamp the profundus branches were controlled with vessel loops. The common femoral artery was opened with an 11 blade incision longitudinally onto the origin of the superficial femoral artery. The profundus had some suggestion of stenosis on the preoperative arteriogram. This in fact was widely patent with a 3 dilator passed easily through this with no evidence of stenosis. There did appear to be subacute thrombus in the superficial  femoral artery. A 4 Fogarty catheter was passed through this and a great deal of thrombus material was removed. Fogarty catheter could be passed to  the level of the midcalf. There was evidence of stenosis in the mid thigh superficial femoral artery on removing the Fogarty. There was arterial backbleeding from this. The vein was spatulated and was non-reversed and was sewn end-to-side to the junction of the superficial femoral and common femoral artery. This was with a running 60 proline suture. Anastomosis tested and found to be adequate. Next a [place] [employer] valvulotome was used to lyse the vein valves. This gave excellent thrill through the vein graft. The vein was brought to the prior created tunnel to the level of the above-knee popliteal artery. The above-knee popliteal artery was occluded proximal and distally was opened 11 blade incision longitudinally with Potts scissors. Fogarty catheter was sent proximally and no further thrombus was removed. Fogarty catheter was distally and additional subacute thrombus was removed from the popliteal artery and the below-knee popliteal artery range. There was backbleeding. This clearly was new since the arteriogram 48 hours earlier. When no further thrombus was removed the vein was cut to appropriate length and was spatulated and sewn end-to-side to the artery with a running 60 proline suture. Prior to completion of the anastomosis the usual flushing maneuvers were undertaken anastomosis was completed. Intraoperative arteriogram was obtained. This did show a poor flow through the tibial vessels. There was some flow into the anterior tibial which have not been present on the preoperative arteriogram. For this reason a separate incision was made on the medial approach to the below-knee popliteal artery. The artery itself was soft and had a good pulse. The artery was dissected caliber. The anterior tibial and tibioperoneal trunks were individually isolated and controlled with red Vesseloops Potts ties. The popliteal artery was occluded more proximally and the artery was opened transversely with 11 blade. 4 Fogarty was passed  centrally and no further thrombus was removed proximally. A 3 Fogarty was passed through the anterior tibial artery. This could go approximately 10 cm and then met resistance from her chronic occlusion. The catheter was passed down the tibioperoneal trunk and and actually went to midcalf. There did appear to be thrombus in the peroneal artery which had been patent on the preoperative arteriogram. For this reason the posterior tibial artery and there and peroneal arteries were individually exposed TO tibioperoneal trunk. The 3 Fogarty could be directed down the posterior tibial but would not go down the peroneal artery. For this reason a separate longitudinal incision was made at the origin of the peroneal artery on the tibioperoneal trunk. 3 Fogarty catheter was passed down the peroneal artery and there was thrombus at the origin. The 3 Fogarty would only pass approximately mid calf due to chronic occlusive disease as seen on the preoperative arteriogram. The 3 Fogarty would go to the mid to distal calf on the posterior tibial only. The office to normal incision was closed using the vein patch on the tibioperoneal trunk. Transverse incision and the below-knee popliteal artery was closed with a interrupted 60 proline sutures. The patient was redosed with heparin during the procedure. Patient had audible faint signals at the posterior tibial peroneal level in the operating room. The heparin was not reversed. The wounds were closed with 2-0 Monocryl and the popliteal and groin areas. The skin was closed with 30 and40 Vicrel subcuticular closure.Transferred to recovery room stable condition   Gretta Began, M.D. 02/10/2015 6:52 PM

## 2015-02-10 NOTE — Interval H&P Note (Signed)
History and Physical Interval Note:  02/10/2015 8:23 AM  Cory Woodward  has presented today for surgery, with the diagnosis of Ischemic left lower extremity M62.89  The various methods of treatment have been discussed with the patient and family. After consideration of risks, benefits and other options for treatment, the patient has consented to  Procedure(s): BYPASS GRAFT FEMORAL-POPLITEAL ARTERY (Left) as a surgical intervention .  The patient's history has been reviewed, patient examined, no change in status, stable for surgery.  I have reviewed the patient's chart and labs.  Questions were answered to the patient's satisfaction.     Gretta BeganEarly, Shanayah Kaffenberger

## 2015-02-10 NOTE — Transfer of Care (Signed)
Immediate Anesthesia Transfer of Care Note  Patient: Cory Woodward  Procedure(s) Performed: Procedure(s): LEFT FEMORAL-ABOVE KNEE POPLITEAL ARTERY BYPASS GRAFT (Left) INTRA OPERATIVE ARTERIOGRAM (Left) LEFT BELOW KNEE POPLITEAL ARTERY EXPLORATION (Left) LEFT ANTERIOR TIBIAL, POSTERIOR TIBIAL AND PERONEAL ARTERY EMBOLECTOMY (Left)  Patient Location: PACU  Anesthesia Type:General  Level of Consciousness: awake and alert   Airway & Oxygen Therapy: Patient Spontanous Breathing and Patient connected to face mask oxygen  Post-op Assessment: Report given to RN, Post -op Vital signs reviewed and stable and Patient moving all extremities X 4  Post vital signs: Reviewed and stable  Last Vitals:  Filed Vitals:   02/10/15 1708  BP: 126/80  Pulse: 84  Temp: 36.8 C  Resp: 14    Complications: No apparent anesthesia complications

## 2015-02-10 NOTE — Anesthesia Procedure Notes (Signed)
Procedure Name: Intubation Date/Time: 02/10/2015 12:10 PM Performed by: Lovie CholOCK, An Lannan K Pre-anesthesia Checklist: Patient identified, Emergency Drugs available, Suction available, Patient being monitored and Timeout performed Patient Re-evaluated:Patient Re-evaluated prior to inductionOxygen Delivery Method: Circle system utilized Preoxygenation: Pre-oxygenation with 100% oxygen Intubation Type: IV induction Ventilation: Mask ventilation without difficulty and Oral airway inserted - appropriate to patient size Laryngoscope Size: Miller and 3 Grade View: Grade I Tube type: Oral Tube size: 7.5 mm Number of attempts: 1 Airway Equipment and Method: Stylet Placement Confirmation: ETT inserted through vocal cords under direct vision,  positive ETCO2,  CO2 detector and breath sounds checked- equal and bilateral Secured at: 23 cm Tube secured with: Tape Dental Injury: Teeth and Oropharynx as per pre-operative assessment

## 2015-02-10 NOTE — Anesthesia Preprocedure Evaluation (Addendum)
Anesthesia Evaluation  Patient identified by MRN, date of birth, ID band Patient awake    Reviewed: Allergy & Precautions, NPO status , Patient's Chart, lab work & pertinent test results  History of Anesthesia Complications Negative for: history of anesthetic complications  Airway Mallampati: II  TM Distance: >3 FB Neck ROM: Full    Dental  (+) Teeth Intact, Dental Advisory Given   Pulmonary COPD, Current Smoker,    breath sounds clear to auscultation       Cardiovascular hypertension, Pt. on medications (-) angina+ Peripheral Vascular Disease   Rhythm:Regular Rate:Normal     Neuro/Psych negative neurological ROS     GI/Hepatic negative GI ROS, (+)     substance abuse  alcohol use,   Endo/Other  Morbid obesity  Renal/GU negative Renal ROS     Musculoskeletal   Abdominal (+) + obese,   Peds  Hematology negative hematology ROS (+)   Anesthesia Other Findings   Reproductive/Obstetrics                           Anesthesia Physical Anesthesia Plan  ASA: III  Anesthesia Plan: General   Post-op Pain Management:    Induction: Intravenous  Airway Management Planned: Oral ETT  Additional Equipment:   Intra-op Plan:   Post-operative Plan: Extubation in OR  Informed Consent: I have reviewed the patients History and Physical, chart, labs and discussed the procedure including the risks, benefits and alternatives for the proposed anesthesia with the patient or authorized representative who has indicated his/her understanding and acceptance.   Dental advisory given  Plan Discussed with: CRNA and Surgeon  Anesthesia Plan Comments: (Plan routine monitors, GETA Although pt with asymptomatic cardiac history, Dr. Anne FuSkains recommends stress test given degree of peripheral vascular disease.  [name] is aware, yet blood flow to lower extrem is urgently compromised, wishes to proceed to preserve  limb. Pt understands, accepts.)        Anesthesia Quick Evaluation

## 2015-02-10 NOTE — Anesthesia Postprocedure Evaluation (Signed)
Anesthesia Post Note  Patient: Cory Woodward  Procedure(s) Performed: Procedure(s) (LRB): LEFT FEMORAL-ABOVE KNEE POPLITEAL ARTERY BYPASS GRAFT (Left) INTRA OPERATIVE ARTERIOGRAM (Left) LEFT BELOW KNEE POPLITEAL ARTERY EXPLORATION (Left) LEFT ANTERIOR TIBIAL, POSTERIOR TIBIAL AND PERONEAL ARTERY EMBOLECTOMY (Left)  Anesthesia type: general  Patient location: PACU  Post pain: Pain level controlled  Post assessment: Patient's Cardiovascular Status Stable  Last Vitals:  Filed Vitals:   02/10/15 1915  BP: 135/80  Pulse:   Temp:   Resp:     Post vital signs: Reviewed and stable  Level of consciousness: sedated  Complications: No apparent anesthesia complications

## 2015-02-10 NOTE — H&P (View-Only) (Signed)
Patient is a 44-year-old male who presented for follow-up today Re: Left leg ischemic symptoms. He underwent a left lower extremity arteriogram by Dr. Early yesterday. This was an uneventful diagnostic procedure. The patient was noted to have a possible profunda stenosis with patent inflow and a chronic left superficial femoral artery occlusion a patent above-knee popliteal arterywith one-vessel runoff by the peroneal artery.   The patient had been primarily experiencing claudication symptoms. However, over the last 24 hours this has progressed to rest pain and coolness in the left foot.   The pain is improved somewhat when he sits and dangles the foot.   Physical exam: Filed Vitals:   02/09/15 1157 02/09/15 1159  BP: 160/100 153/99  Pulse: 75   Height: 6' 1" (1.854 m)   Weight: 242 lb (109.77 kg)   SpO2: 95%     Neck left-sided carotid bruit no right bruit  chest: Clear to auscultation bilaterally.  Cardiac: Regular rate and rhythm without murmur  abdomen: Soft nontender nondistended  , left groin puncture site without hematoma mildly tender,    vascular:Triphasic Doppler flow at the femoral level , monophasic Doppler flow at the popliteal level , no audible Doppler flow in the left foot,  Left foot toes are dusky compared to the right. There is delayed capillary refill compared to the right ,  Motor and sensation currently are intact although the patient stated that the left foot was more numb earlier   Assessment: patient with threatened left limb due to progressive chronic ischemia  Plan: the patient had originally been scheduled for a cardiac evaluation early next week. However due to his progression of symptoms. I believe he is at risk of limb loss without urgent bypass. We have scheduled this for Dr. Early for tomorrow. The patient has previously had the procedure details risks benefits possible complications discussed with him by Dr. Early. I did discuss with the patient today that there  is some cardiac risk involved with any lower extremity vascular procedure he understands and agrees to proceed.  Needs carotid duplex at some point to eval left side bruit  Karyn Brull, MD [facility] [place] Office: [phone] Pager: [phone]  

## 2015-02-10 NOTE — Progress Notes (Signed)
Patient awake and comfortable in the recovery room. Numbness has resolved in his left foot. Only Doppler flow in his left foot is at the perineal above the ankle. Discussed situation at length with the patient and also his wife and family. Severe tibial disease. Unable to open the posterior tibial and the anterior tibial towards the foot. Currently had is improved from his preoperative state. Certainly at risk for repeat occlusion due to such poor runoff. We'll continue to watch.

## 2015-02-10 NOTE — Progress Notes (Addendum)
     Left leg incision clean Active range of motion of toes intact left foot Doppler high peroneal signal, no PT or DP  S/P Fem-AK popliteal by pass with embolectomies of peroneal and AT arteries below the knee Awaiting bed 2S    Due to hospitalization we will re schedule his cardiac f/u visit by calling Tr[facility] on Mon.  Jamile Sivils MAUREEN PA-C

## 2015-02-11 LAB — BASIC METABOLIC PANEL
Anion gap: 10 (ref 5–15)
BUN: 12 mg/dL (ref 6–20)
CO2: 27 mmol/L (ref 22–32)
CREATININE: 1 mg/dL (ref 0.61–1.24)
Calcium: 8.4 mg/dL — ABNORMAL LOW (ref 8.9–10.3)
Chloride: 98 mmol/L — ABNORMAL LOW (ref 101–111)
GFR calc Af Amer: >60 mL/min (ref >60)
Glucose, Bld: 137 mg/dL — ABNORMAL HIGH (ref 65–99)
POTASSIUM: 3.5 mmol/L (ref 3.5–5.1)
SODIUM: 135 mmol/L (ref 135–145)

## 2015-02-11 LAB — CBC
HCT: 41.7 % (ref 39.0–52.0)
Hemoglobin: 13.9 g/dL (ref 13.0–17.0)
MCH: 30.5 pg (ref 26.0–34.0)
MCHC: 33.3 g/dL (ref 30.0–36.0)
MCV: 91.6 fL (ref 78.0–100.0)
PLATELETS: 181 10*3/uL (ref 150–400)
RBC: 4.55 MIL/uL (ref 4.22–5.81)
RDW: 12.4 % (ref 11.5–15.5)
WBC: 15.9 10*3/uL — ABNORMAL HIGH (ref 4.0–10.5)
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
## 2015-02-11 NOTE — Progress Notes (Signed)
Report called to [facility] on [facility], informed of Dr Bosie HelperEarly's order to leave left left dressing intact until rounds tomorrow. Louie BunWilson,Denora Wysocki S 9:08 AM

## 2015-02-11 NOTE — Progress Notes (Signed)
Subjective: Interval History: none.. Comfortable.  No rest pain.  Objective: Vital signs in last 24 hours: Temp:  [97.8 F (36.6 C)-98.8 F (37.1 C)] 97.8 F (36.6 C) (11/05 0400) Pulse Rate:  [60-95] 67 (11/05 0700) Resp:  [9-19] 13 (11/05 0700) BP: (113-143)/(61-90) 114/71 mmHg (11/05 0700) SpO2:  [96 %-100 %] 99 % (11/05 0700) Weight:  [242 lb (109.77 kg)] 242 lb (109.77 kg) (11/04 0901)  Intake/Output from previous day: 11/04 0701 - 11/05 0700 In: 4615 [P.O.:480; I.V.:4135] Out: 1960 [Urine:1760; Blood:200] Intake/Output this shift:    Dressings all dry and intact. No hematomas. Left foot warm. Audible flow at anterior tibial and posterior tibial at the ankle level. Also peroneal signals. Motor and sensory intact in foot  Lab Results:  Recent Labs  02/10/15 0906 02/11/15 0436  WBC 12.0* 15.9*  HGB 16.0 13.9  HCT 47.4 41.7  PLT 201 181   BMET  Recent Labs  02/10/15 0906 02/11/15 0436  NA 136 135  K 3.6 3.5  CL 99* 98*  CO2 24 27  GLUCOSE 100* 137*  BUN 14 12  CREATININE 1.05 1.00  CALCIUM 9.2 8.4*    Studies/Results: Dg Ang/ext/uni/or Left  02/10/2015  CLINICAL DATA:  Intraoperative arteriogram EXAM: LEFT ANG/EXT/UNI/ OR COMPARISON:  None FINDINGS: A single spot intraoperative angiographic image of the left lower extremity is provided for review. Intra-arterial contrast has been injected from a more proximal location. Provided image demonstrates opacification of distal aspect of the popliteal artery though there is suboptimal opacification of the intra-articular segment of the popliteal artery. Note is made of a hypertrophied geniculate collateral which originates within distal aspect of the above knee popliteal artery. The tibioperoneal trunk is suboptimally opacified with evaluation further degraded secondary to overlying osseous structures. There is minimal opacification of both the proximal aspects of the anterior tibial and peroneal arteries of these  vessels only appear patent proximally. There is apparent occlusion of the posterior tibial artery with short segment reconstitution at the mid aspect of the left lower leg. No definitive discrete intraluminal filling defects to suggest distal embolism. Surgical clips are noted about the medial aspect of the image lower thigh. IMPRESSION: Intraoperative arteriogram as above. Electronically Signed   By: Simonne ComeJohn  Watts M.D.   On: 02/10/2015 15:37   Anti-infectives: Anti-infectives    Start     Dose/Rate Route Frequency Ordered Stop   02/10/15 1615  cefUROXime (ZINACEF) 1.5 g in dextrose 5 % 50 mL IVPB     1.5 g 100 mL/hr over 30 Minutes Intravenous To Surgery 02/10/15 1613 02/11/15 1615   02/10/15 0836  cefUROXime (ZINACEF) 1.5 g in dextrose 5 % 50 mL IVPB     1.5 g 100 mL/hr over 30 Minutes Intravenous 30 min pre-op 02/10/15 0836 02/10/15 1618      Assessment/Plan: s/p Procedure(s): LEFT FEMORAL-ABOVE KNEE POPLITEAL ARTERY BYPASS GRAFT (Left) INTRA OPERATIVE ARTERIOGRAM (Left) LEFT BELOW KNEE POPLITEAL ARTERY EXPLORATION (Left) LEFT ANTERIOR TIBIAL, POSTERIOR TIBIAL AND PERONEAL ARTERY EMBOLECTOMY (Left) Stable postop day 1. Normal flow to popliteal level but to severe tibial disease. Now has audible flow posterior tibial anterior tibial and peroneal. Will transfer to 2 west and begin to mobilize. Again explained the potential for limb threatening ischemia due to tibial disease.   LOS: 1 day   Gretta Beganarly, [place] Hollar 02/11/2015, 7:49 AM

## 2015-02-11 NOTE — Progress Notes (Signed)
Pt transferred to 2West room 5, vss, 2 west RN at bedside. Cory Woodward,Cory Woodward S 10:20 AM

## 2015-02-12 ENCOUNTER — Encounter (HOSPITAL_COMMUNITY): Payer: BLUE CROSS/BLUE SHIELD

## 2015-02-12 MED ORDER — ASPIRIN 81 MG PO CHEW: 81.0000 mg | CHEWABLE_TABLET | Freq: Every day | ORAL | Status: AC

## 2015-02-12 MED ORDER — OXYCODONE-ACETAMINOPHEN 5-325 MG PO TABS: 1.0000 | ORAL_TABLET | ORAL | Status: DC | PRN

## 2015-02-12 NOTE — Progress Notes (Signed)
Discussed with the patient and his wife - all questioned fully answered. He will call me if any problems arise. Discharge education included pain management, medications (prescription given for Percocet), signs of infection, activity level, and incision care.   Leonidas Rombergaitlin S Bumbledare, RN

## 2015-02-12 NOTE — Progress Notes (Addendum)
[facility] [place]  Subjective  - Doing well, tingling in the first two toes when walking.  He has walked the halls multiple times, tolerating PO's well and voiding without difficulties.   Objective 133/66 65 97.7 F (36.5 C) (Oral) 18 98%  Intake/Output Summary (Last 24 hours) at 02/12/15 0739 Last data filed at 02/11/15 1400  Gross per 24 hour  Intake 1011.75 ml  Output    440 ml  Net 571.75 ml    Doppler signal PT/DP/Peroneal Incisions clean and dry Heart RRR Lungs non labored breathing  Assessment/Planning: POD # 2  LEFT FEMORAL-ABOVE KNEE POPLITEAL ARTERY BYPASS GRAFT (Left) INTRA OPERATIVE ARTERIOGRAM (Left) LEFT BELOW KNEE POPLITEAL ARTERY EXPLORATION (Left) LEFT ANTERIOR TIBIAL, POSTERIOR TIBIAL AND PERONEAL ARTERY EMBOLECTOMY (Left) Stable doppler flow in all three vessels Pain well controller I will order a rolling walker for home use Discharge planning pending  Mosetta PigeonCOLLINS, EMMA MAUREEN 02/12/2015 7:39 AM --  Laboratory Lab Results:  Recent Labs  02/10/15 0906 02/11/15 0436  WBC 12.0* 15.9*  HGB 16.0 13.9  HCT 47.4 41.7  PLT 201 181   BMET  Recent Labs  02/10/15 0906 02/11/15 0436  NA 136 135  K 3.6 3.5  CL 99* 98*  CO2 24 27  GLUCOSE 100* 137*  BUN 14 12  CREATININE 1.05 1.00  CALCIUM 9.2 8.4*    COAG Lab Results  Component Value Date   INR 1.07 02/10/2015   No results found for: PTT    I have examined the patient, reviewed and agree with above.  Main complaint is incisional soreness.  Well-perfused with no evidence of ischemia.  Better audible flow in the anterior tibial and posterior tibial arteries.  Wounds are healing well.  Okay for discharge to home today.  Will notify should he have any new ischemic symptoms.  Otherwise will see him back in the office in 2-3 weeks.  Gretta BeganEarly, Thurza Kwiecinski, MD 02/12/2015 9:17 AM

## 2015-02-12 NOTE — Progress Notes (Addendum)
Patient slept comfortably throughout the night. Walked twice this morning twice the the other end of the hall. Pain is at a 4, meds given.  Wife at bedside. No distress, call light within reach.

## 2015-02-13 ENCOUNTER — Other Ambulatory Visit: Payer: Self-pay

## 2015-02-13 ENCOUNTER — Encounter (HOSPITAL_COMMUNITY): Payer: Self-pay | Admitting: Vascular Surgery

## 2015-02-13 DIAGNOSIS — I739 Peripheral vascular disease, unspecified: Secondary | ICD-10-CM

## 2015-02-13 DIAGNOSIS — Z48812 Encounter for surgical aftercare following surgery on the circulatory system: Secondary | ICD-10-CM

## 2015-02-13 MED FILL — Mupirocin Oint 2%: CUTANEOUS | Qty: 22 | Status: AC

## 2015-02-13 NOTE — Care Management (Signed)
Pt discharged home 02/12/15.  MD wrote orders for rolling walker prior to discharge.  Pt discharged without CM note stating DME was offered.  CM contacted pt on Monday 02/13/15; pt stated he borrowed rolling walker from mom and did not need DME prior to discharge.

## 2015-02-14 ENCOUNTER — Encounter (HOSPITAL_COMMUNITY): Payer: BLUE CROSS/BLUE SHIELD

## 2015-02-14 ENCOUNTER — Telehealth: Payer: Self-pay | Admitting: Vascular Surgery

## 2015-02-14 NOTE — Telephone Encounter (Signed)
-----   Message from Phillips Odorarol S Pullins, RN sent at 02/13/2015  9:57 AM EST ----- Regarding: needs 2 wk. f/u with [name] with ABI's   ----- Message -----    From: Lars MageEmma M Collins, PA-C    Sent: 02/12/2015   9:09 AM      To: Vvs Charge Pool  F/U with [name] in 2 weeks s/p left fem-pop with embolectomy.  Needs bilateral ABI's

## 2015-02-14 NOTE — Telephone Encounter (Signed)
Spoke with pt, dpm °

## 2015-02-15 NOTE — Discharge Summary (Signed)
[facility] Discharge Summary   Patient ID:  Cory Woodward MRN: [id number] DOB/AGE: 44-Feb-1972 44 y.o.  Admit date: 02/10/2015 Discharge date: 02/12/2015 Date of Surgery: 02/10/2015 Surgeon: Surgeon(s): Larina Earthly, MD  Admission Diagnosis: Ischemic left lower extremity M62.89  Discharge Diagnoses:  Ischemic left lower extremity M62.89  Secondary Diagnoses: Past Medical History  Diagnosis Date  . Hypertension   . Peripheral vascular disease (HCC)   . Chest pain     occasional  . Headache     Procedure(s): LEFT FEMORAL-ABOVE KNEE POPLITEAL ARTERY BYPASS GRAFT INTRA OPERATIVE ARTERIOGRAM LEFT BELOW KNEE POPLITEAL ARTERY EXPLORATION LEFT ANTERIOR TIBIAL, POSTERIOR TIBIAL AND PERONEAL ARTERY EMBOLECTOMY  Discharged Condition: good  HPI: Patient is a 44 year old male who presented for follow-up today Re: Left leg ischemic symptoms. He underwent a left lower extremity arteriogram by Dr. Arbie Cookey yesterday. This was an uneventful diagnostic procedure. The patient was noted to have a possible profunda stenosis with patent inflow and a chronic left superficial femoral artery occlusion a patent above-knee popliteal arterywith one-vessel runoff by the peroneal artery. The patient had been primarily experiencing claudication symptoms. However, over the last 24 hours this has progressed to rest pain and coolness in the left foot. The pain is improved somewhat when he sits and dangles the foot.    Hospital Course:  Cory Woodward is a 44 y.o. male is S/P  Procedure(s): LEFT FEMORAL-ABOVE KNEE POPLITEAL ARTERY BYPASS GRAFT INTRA OPERATIVE ARTERIOGRAM LEFT BELOW KNEE POPLITEAL ARTERY EXPLORATION LEFT ANTERIOR TIBIAL, POSTERIOR TIBIAL AND PERONEAL ARTERY EMBOLECTOMY  POD#1  Dressings all dry and intact. No hematomas. Left foot warm. Audible flow at anterior tibial and posterior tibial at the ankle level. Also peroneal signals. Motor and sensory intact in foot.   transfer to 2 west and begin to mobilize.   POD#2 LEFT FEMORAL-ABOVE KNEE POPLITEAL ARTERY BYPASS GRAFT (Left) INTRA OPERATIVE ARTERIOGRAM (Left) LEFT BELOW KNEE POPLITEAL ARTERY EXPLORATION (Left) LEFT ANTERIOR TIBIAL, POSTERIOR TIBIAL AND PERONEAL ARTERY EMBOLECTOMY (Left) Stable doppler flow in all three vessels Pain well controller I will order a rolling walker for home use Discharge home  Significant Diagnostic Studies: CBC Lab Results  Component Value Date   WBC 15.9* 02/11/2015   HGB 13.9 02/11/2015   HCT 41.7 02/11/2015   MCV 91.6 02/11/2015   PLT 181 02/11/2015    BMET    Component Value Date/Time   NA 135 02/11/2015 0436   K 3.5 02/11/2015 0436   CL 98* 02/11/2015 0436   CO2 27 02/11/2015 0436   GLUCOSE 137* 02/11/2015 0436   BUN 12 02/11/2015 0436   CREATININE 1.00 02/11/2015 0436   CALCIUM 8.4* 02/11/2015 0436   GFRNONAA >60 02/11/2015 0436   GFRAA >60 02/11/2015 0436   COAG Lab Results  Component Value Date   INR 1.07 02/10/2015     Disposition:  Discharge to :Home Discharge Instructions    Call MD for:  redness, tenderness, or signs of infection (pain, swelling, bleeding, redness, odor or green/yellow discharge around incision site)    Complete by:  As directed      Call MD for:  severe or increased pain, loss or decreased feeling  in affected limb(s)    Complete by:  As directed      Call MD for:  temperature >100.5    Complete by:  As directed      Discharge instructions    Complete by:  As directed   You may shower daily dry incisions well.  Discharge patient    Complete by:  As directed   Discharge pt to home     Driving Restrictions    Complete by:  As directed   No driving for 2 weeks     Increase activity slowly    Complete by:  As directed   Walk with assistance use walker or cane as needed     Lifting restrictions    Complete by:  As directed   No lifting for 6 weeks     Resume previous diet    Complete by:  As directed              Medication List    TAKE these medications        aspirin 81 MG chewable tablet  Chew 1 tablet (81 mg total) by mouth daily.     losartan-hydrochlorothiazide 100-25 MG tablet  Commonly known as:  HYZAAR  Take 1 tablet by mouth daily.     nicotine 14 mg/24hr patch  Commonly known as:  NICODERM CQ - dosed in mg/24 hours  Place 14 mg onto the skin daily.     oxyCODONE-acetaminophen 5-325 MG tablet  Commonly known as:  PERCOCET/ROXICET  Take 1-2 tablets by mouth every 4 (four) hours as needed for moderate pain.       Verbal and written Discharge instructions given to the patient. Wound care per Discharge AVS     Follow-up Information    Follow up with Gretta BeganEarly, Todd, MD In 2 weeks.   Specialties:  Vascular Surgery, Cardiology   Why:  Office will call you to arrange your appt (sent)   Contact information:   [address] [place] [address] [phone]       Signed: Clinton GallantCOLLINS, Lashawn Bromwell [facility] 02/15/2015, 10:50 AM  - For VQI Registry use --- Instructions: Press F2 to tab through selections.  Delete question if not applicable.   Post-op:  Wound infection: No  Graft infection: No  Transfusion: No  If yes, 0 units given New Arrhythmia: No Ipsilateral amputation: [x ] no, [ ]  Minor, [ ]  BKA, [ ]  AKA Discharge patency: [x ] Primary, [ ]  Primary assisted, [ ]  Secondary, [ ]  Occluded Patency judged by: [x ] Dopper only, [ ]  Palpable graft pulse, [ ]  Palpable distal pulse, [ ]  ABI inc. > 0.15, [ ]  Duplex  D/C Ambulatory Status: Ambulatory  Complications: MI: [ ]  No, [ ]  Troponin only, [ ]  EKG or Clinical CHF: No Resp failure: [x ] none, [ ]  Pneumonia, [ ]  Ventilator Chg in renal function: [x ] none, [ ]  Inc. Cr > 0.5, [ ]  Temp. Dialysis, [ ]  Permanent dialysis Stroke: [x ] None, [ ]  Minor, [ ]  Major Return to OR: No  Reason for return to OR: [ ]  Bleeding, [ ]  Infection, [ ]  Thrombosis, [ ]  Revision  Discharge medications: Statin use:  No  for medical reason    ASA use:  Yes Plavix use:  No  for medical reason   Beta blocker use: No  for medical reason   Coumadin use: No  for medical reason

## 2015-02-20 ENCOUNTER — Encounter: Payer: Self-pay | Admitting: Vascular Surgery

## 2015-02-21 ENCOUNTER — Encounter: Payer: Self-pay | Admitting: Vascular Surgery

## 2015-02-21 ENCOUNTER — Ambulatory Visit (INDEPENDENT_AMBULATORY_CARE_PROVIDER_SITE_OTHER): Payer: BLUE CROSS/BLUE SHIELD | Admitting: Vascular Surgery

## 2015-02-21 ENCOUNTER — Ambulatory Visit (HOSPITAL_COMMUNITY)
Admission: RE | Admit: 2015-02-21 | Discharge: 2015-02-21 | Disposition: A | Discharge status: Home / Self Care | Payer: BLUE CROSS/BLUE SHIELD | Source: Ambulatory Visit | Attending: Vascular Surgery | Admitting: Vascular Surgery

## 2015-02-21 VITALS — BP 112/78 | HR 68 | Temp 98.5°F | Ht 73.0 in | Wt 243.3 lb

## 2015-02-21 DIAGNOSIS — I739 Peripheral vascular disease, unspecified: Secondary | ICD-10-CM | POA: Diagnosis not present

## 2015-02-21 DIAGNOSIS — Z48812 Encounter for surgical aftercare following surgery on the circulatory system: Secondary | ICD-10-CM

## 2015-02-21 DIAGNOSIS — G8918 Other acute postprocedural pain: Secondary | ICD-10-CM

## 2015-02-21 MED ORDER — OXYCODONE-ACETAMINOPHEN 5-325 MG PO TABS: 1.0000 | ORAL_TABLET | ORAL | Status: DC | PRN

## 2015-02-21 NOTE — Progress Notes (Signed)
   Patient name: Cory Woodward Wuthrich MRN: [id number] DOB: 12/21/1970 Sex: male  REASON FOR VISIT: Follow-up of left femoral-popliteal bypass  HPI: Cory Woodward Heindel is a 44 y.o. male patient initially presented on 02/07/2015 with severe claudication. He underwent arteriography on 02/08/2015. He subtotally had the worsening of his pain and was taken to the operating room on 11 4 for left femoral-popliteal bypass. At surgery was fine found to have occluded his popliteal artery which would been patent 48 hours earlier with his arteriogram. He had severe tibial disease at the time of arteriography with single-vessel runoff through a diseased peroneal artery. Intraoperative study suggesting that there may be some clot in the tibials and therefore he underwent femoral to above-knee popliteal was easily thrombectomized popliteal artery. I did explore his below-knee popliteal and individually attempted thrombectomy of his tibial vessels. These were small and chronically diseased and the occluded. He did well postoperatively. Continue to have flow to his foot. Was discharged home. He is done well since discharge. He does have the typical postoperative swelling. All his incisions are  Current Outpatient Prescriptions  Medication Sig Dispense Refill  . aspirin 81 MG chewable tablet Chew 1 tablet (81 mg total) by mouth daily. 30 tablet 3  . losartan-hydrochlorothiazide (HYZAAR) 100-25 MG tablet Take 1 tablet by mouth daily.    . nicotine (NICODERM CQ - DOSED IN MG/24 HOURS) 14 mg/24hr patch Place 14 mg onto the skin daily.    Marland Kitchen. oxyCODONE-acetaminophen (PERCOCET/ROXICET) 5-325 MG tablet Take 1-2 tablets by mouth every 4 (four) hours as needed for moderate pain. 30 tablet 0   No current facility-administered medications for this visit.       PHYSICAL EXAM: Filed Vitals:   02/21/15 0932  BP: 112/78  Pulse: 68  Temp: 98.5 F (36.9 C)  TempSrc: Oral  Height: 6\' 1"  (1.854 m)  Weight: 243 lb 4.8 oz (110.36 kg)    SpO2: 97%    GENERAL: The patient is a well-nourished male, in no acute distress. The vital signs are documented above. All wounds are healing up nicely on his left leg. His foot is well perfused. He does have some swelling. He has an easily palpable popliteal pulse.  Noninvasive studies today reveal ankle arm index of 0.82 on the left with biphasic flow in his posterior tibial and dorsalis pedis artery.  MEDICAL ISSUES: Stable 2 weeks out from surgery. I will continue his walking program. 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 He is not able to do this currently. He will notify us when he feels comfortable returning to work. Was given a prescription for Percocet 5/325 #30 no refills and will also begin using Advil. Please we will see him in 3 months with repeat noninvasive studies and he will notify should he develop any recurrent ischemic symptoms or wound issues  Vishruth Seoane [facility] [place]: [phone]

## 2015-02-21 NOTE — Addendum Note (Signed)
Addended by: Adria DillELDRIDGE-LEWIS, Midas Daughety L on: 02/21/2015 04:07 PM   Modules accepted: Orders

## 2015-02-22 ENCOUNTER — Ambulatory Visit: Payer: BLUE CROSS/BLUE SHIELD | Admitting: Cardiovascular Disease

## 2015-04-13 ENCOUNTER — Telehealth: Payer: Self-pay | Admitting: Cardiovascular Disease

## 2015-04-13 NOTE — Telephone Encounter (Signed)
ERROR

## 2015-04-25 ENCOUNTER — Encounter: Payer: Self-pay | Admitting: Cardiovascular Disease

## 2015-04-25 ENCOUNTER — Ambulatory Visit (INDEPENDENT_AMBULATORY_CARE_PROVIDER_SITE_OTHER): Payer: BLUE CROSS/BLUE SHIELD | Admitting: Cardiovascular Disease

## 2015-04-25 VITALS — BP 138/78 | HR 65 | Ht 73.0 in | Wt 248.4 lb

## 2015-04-25 DIAGNOSIS — I739 Peripheral vascular disease, unspecified: Secondary | ICD-10-CM

## 2015-04-25 DIAGNOSIS — R0989 Other specified symptoms and signs involving the circulatory and respiratory systems: Secondary | ICD-10-CM

## 2015-04-25 DIAGNOSIS — R0789 Other chest pain: Secondary | ICD-10-CM

## 2015-04-25 DIAGNOSIS — E785 Hyperlipidemia, unspecified: Secondary | ICD-10-CM

## 2015-04-25 LAB — HEPATIC FUNCTION PANEL
ALBUMIN: 4.2 g/dL (ref 3.6–5.1)
ALT: 48 U/L — AB (ref 9–46)
AST: 22 U/L (ref 10–40)
Alkaline Phosphatase: 43 U/L (ref 40–115)
Bilirubin, Direct: 0.1 mg/dL (ref <=0.2)
Indirect Bilirubin: 0.4 mg/dL (ref 0.2–1.2)
TOTAL PROTEIN: 6.6 g/dL (ref 6.1–8.1)
Total Bilirubin: 0.5 mg/dL (ref 0.2–1.2)

## 2015-04-25 LAB — LIPID PANEL
CHOL/HDL RATIO: 5.9 ratio — AB (ref <=5.0)
CHOLESTEROL: 177 mg/dL (ref 125–200)
HDL: 30 mg/dL — AB (ref >=40)
LDL Cholesterol: 123 mg/dL (ref <130)
TRIGLYCERIDES: 119 mg/dL (ref <150)
VLDL: 24 mg/dL (ref <30)

## 2015-04-25 NOTE — Patient Instructions (Signed)
Medication Instructions:  Your physician recommends that you continue on your current medications as directed. Please refer to the Current Medication list given to you today.  Labwork: Your physician recommends that you have lab work today: LIPID and LIVER  Testing/Procedures: Your physician has requested that you have an exercise stress myoview. For further information please visit [email or web address] Please follow instruction sheet, as given.  Your physician has requested that you have a carotid duplex. This test is an ultrasound of the carotid arteries in your neck. It looks at blood flow through these arteries that supply the brain with blood. Allow one hour for this exam. There are no restrictions or special instructions.  Follow-Up: Your physician recommends that you schedule a follow-up appointment in: 3 MONTHS with Dr Kirke Corin   Any Other Special Instructions Will Be Listed Below (If Applicable).     If you need a refill on your cardiac medications before your next appointment, please call your pharmacy.

## 2015-04-27 ENCOUNTER — Other Ambulatory Visit: Payer: Self-pay

## 2015-04-27 ENCOUNTER — Encounter: Payer: Self-pay | Admitting: Cardiovascular Disease

## 2015-04-27 DIAGNOSIS — R0789 Other chest pain: Secondary | ICD-10-CM | POA: Insufficient documentation

## 2015-04-27 DIAGNOSIS — E785 Hyperlipidemia, unspecified: Secondary | ICD-10-CM

## 2015-04-27 DIAGNOSIS — R0989 Other specified symptoms and signs involving the circulatory and respiratory systems: Secondary | ICD-10-CM | POA: Insufficient documentation

## 2015-04-27 MED ORDER — ATORVASTATIN CALCIUM 40 MG PO TABS: 40.0000 mg | ORAL_TABLET | Freq: Every day | ORAL | Status: AC

## 2015-04-27 NOTE — Assessment & Plan Note (Signed)
Status post left femoropopliteal bypass. This is being followed by Dr. Arbie Cookey.

## 2015-04-27 NOTE — Assessment & Plan Note (Signed)
The patient's chest pain is overall atypical but he has multiple risk factors for coronary artery disease. Thus, I requested a treadmill nuclear stress test for evaluation. He already has known history of atherosclerosis manifested by peripheral arterial disease. Continue treatment of risk factors.

## 2015-04-27 NOTE — Progress Notes (Signed)
HPI  This is a pleasant 45 year old male who is here today for a follow-up visit for chest pain.the patient has known history of hypertension and tobacco use. He was hospitalized in November at [facility] after he presented with an ischemic left leg. Angiography showed occlusion of the left SFA with reconstitution above the knee popliteal artery with thrombus noted in the popliteal artery.  Cardiology was consulted for chest pain before plan left femoropopliteal bypass. The patient was noted to have left carotid bruit. A stress test was advised before surgery. However, the patient's condition became unstable with severe rest pain and thus he underwent urgent left femoral bypass with embolectomy. The patient was subsequently discharged home. He feels significantly better. 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 He quit smoking after hospital discharge. He does have family history of coronary artery disease. His father died at the age of 64 of myocardial infarction. He continues to have intermittent chest pain described as gas which can happen at rest and very rarely with physical activities. No significant shortness of breath.  Allergies  Allergen Reactions  . Peanut-Containing Drug Products Itching    Only has reaction to raw peanuts. No reaction with cooked peanuts, peanut butter, etc     Current Outpatient Prescriptions on File Prior to Visit  Medication Sig Dispense Refill  . aspirin 81 MG chewable tablet Chew 1 tablet (81 mg total) by mouth daily. 30 tablet 3  . losartan-hydrochlorothiazide (HYZAAR) 100-25 MG tablet Take 1 tablet by mouth daily.    . nicotine (NICODERM CQ - DOSED IN MG/24 HOURS) 14 mg/24hr patch Place 14 mg onto the skin daily.     No current facility-administered medications on file prior to visit.     Past Medical History  Diagnosis Date  . Hypertension   . Peripheral vascular disease (HCC)   . Chest pain     occasional  . Headache      Past Surgical  History  Procedure Laterality Date  . Peripheral vascular catheterization N/A 02/08/2015    Procedure: Abdominal Aortogram;  Surgeon: Larina Earthly, MD;  Location: [facility] INVASIVE CV LAB;  Service: Cardiovascular;  Laterality: N/A;  . Finger tendon repair Right     middle  . Femoral-popliteal bypass graft Left 02/10/2015    Procedure: LEFT FEMORAL-ABOVE KNEE POPLITEAL ARTERY BYPASS GRAFT;  Surgeon: Larina Earthly, MD;  Location: [facility] OR;  Service: Vascular;  Laterality: Left;  . Intraoperative arteriogram Left 02/10/2015    Procedure: INTRA OPERATIVE ARTERIOGRAM;  Surgeon: Larina Earthly, MD;  Location: [facility] OR;  Service: Vascular;  Laterality: Left;  . Artery exploration Left 02/10/2015    Procedure: LEFT BELOW KNEE POPLITEAL ARTERY EXPLORATION;  Surgeon: Larina Earthly, MD;  Location: [facility] OR;  Service: Vascular;  Laterality: Left;  . Embolectomy Left 02/10/2015    Procedure: LEFT ANTERIOR TIBIAL, POSTERIOR TIBIAL AND PERONEAL ARTERY EMBOLECTOMY;  Surgeon: Larina Earthly, MD;  Location: [facility] OR;  Service: Vascular;  Laterality: Left;     No family history on file.   Social History   Social History  . Marital Status: Married    Spouse Name: N/A  . Number of Children: N/A  . Years of Education: N/A   Occupational History  . Not on file.   Social History Main Topics  . Smoking status: Former Smoker -- 25 years    Types: Cigarettes  . Smokeless tobacco: Not on file     Comment: last  cigarette  02/06/15- on nicotene patch  . Alcohol Use: 3.0 oz/week    5 Shots of liquor per week     Comment: many once  1 month- several drinks  . Drug Use: No  . Sexual Activity: Not on file   Other Topics Concern  . Not on file   Social History Narrative      PHYSICAL EXAM   BP 138/78 mmHg  Pulse 65  Ht  (1.854 m)  Wt 248 lb 6.4 oz (112.674 kg)  BMI 32.78 kg/m2 Constitutional: He is oriented to person, place, and time. He appears well-developed and well-nourished. No distress.  HENT: No nasal  discharge.  Head: Normocephalic and atraumatic.  Eyes: Pupils are equal and round.  No discharge. Neck: Normal range of motion. Neck supple. No JVD present. No thyromegaly present. Left carotid bruit. Cardiovascular: Normal rate, regular rhythm, normal heart sounds. Exam reveals no gallop and no friction rub. No murmur heard.  Pulmonary/Chest: Effort normal and breath sounds normal. No stridor. No respiratory distress. He has no wheezes. He has no rales. He exhibits no tenderness.  Abdominal: Soft. Bowel sounds are normal. He exhibits no distension. There is no tenderness. There is no rebound and no guarding.  Musculoskeletal: Normal range of motion. He exhibits no edema and no tenderness.  Neurological: He is alert and oriented to person, place, and time. Coordination normal.  Skin: Skin is warm and dry. No rash noted. He is not diaphoretic. No erythema. No pallor.  Psychiatric: He has a normal mood and affect. His behavior is normal. Judgment and thought content normal.       EKG while hospitalized was reviewed hich showed normal sinus rhythm with nonspecific T wave changes   ASSESSMENT AND PLAN

## 2015-04-27 NOTE — Assessment & Plan Note (Signed)
I requested carotid Doppler. 

## 2015-04-27 NOTE — Assessment & Plan Note (Signed)
We should have a low threshold for starting treatment with a statin. I requested fasting lipid and liver profile.

## 2015-05-04 ENCOUNTER — Telehealth (HOSPITAL_COMMUNITY): Payer: Self-pay

## 2015-05-04 NOTE — Telephone Encounter (Signed)
Encounter complete. 

## 2015-05-05 ENCOUNTER — Telehealth (HOSPITAL_COMMUNITY): Payer: Self-pay

## 2015-05-05 NOTE — Telephone Encounter (Signed)
Encounter complete. 

## 2015-05-09 ENCOUNTER — Ambulatory Visit (HOSPITAL_COMMUNITY)
Admission: RE | Admit: 2015-05-09 | Discharge: 2015-05-09 | Disposition: A | Discharge status: Home / Self Care | Payer: BLUE CROSS/BLUE SHIELD | Source: Ambulatory Visit | Attending: Internal Medicine | Admitting: Internal Medicine

## 2015-05-09 ENCOUNTER — Ambulatory Visit (HOSPITAL_COMMUNITY)
Admission: RE | Admit: 2015-05-09 | Discharge: 2015-05-09 | Disposition: A | Discharge status: Home / Self Care | Payer: BLUE CROSS/BLUE SHIELD | Source: Ambulatory Visit | Attending: Cardiovascular Disease | Admitting: Cardiovascular Disease

## 2015-05-09 DIAGNOSIS — R0789 Other chest pain: Secondary | ICD-10-CM

## 2015-05-09 DIAGNOSIS — I739 Peripheral vascular disease, unspecified: Secondary | ICD-10-CM | POA: Diagnosis not present

## 2015-05-09 DIAGNOSIS — I779 Disorder of arteries and arterioles, unspecified: Secondary | ICD-10-CM | POA: Insufficient documentation

## 2015-05-09 DIAGNOSIS — R002 Palpitations: Secondary | ICD-10-CM | POA: Insufficient documentation

## 2015-05-09 DIAGNOSIS — Z87891 Personal history of nicotine dependence: Secondary | ICD-10-CM | POA: Diagnosis not present

## 2015-05-09 DIAGNOSIS — Z8249 Family history of ischemic heart disease and other diseases of the circulatory system: Secondary | ICD-10-CM | POA: Insufficient documentation

## 2015-05-09 DIAGNOSIS — Z6832 Body mass index (BMI) 32.0-32.9, adult: Secondary | ICD-10-CM | POA: Insufficient documentation

## 2015-05-09 DIAGNOSIS — R0989 Other specified symptoms and signs involving the circulatory and respiratory systems: Secondary | ICD-10-CM | POA: Diagnosis not present

## 2015-05-09 DIAGNOSIS — E663 Overweight: Secondary | ICD-10-CM | POA: Diagnosis not present

## 2015-05-09 DIAGNOSIS — R5383 Other fatigue: Secondary | ICD-10-CM | POA: Insufficient documentation

## 2015-05-09 DIAGNOSIS — R0609 Other forms of dyspnea: Secondary | ICD-10-CM | POA: Diagnosis not present

## 2015-05-09 LAB — MYOCARDIAL PERFUSION IMAGING
CHL CUP NUCLEAR SSS: 3
CHL RATE OF PERCEIVED EXERTION: 17
CSEPED: 10 min
CSEPEW: 11.7 METS
CSEPPHR: 153 {beats}/min
LV dias vol: 99 mL
LVSYSVOL: 38 mL
MPHR: 176 {beats}/min
NUC STRESS TID: 0.77
Percent HR: 86 %
Rest HR: 66 {beats}/min
SDS: 0
SRS: 3

## 2015-05-09 MED ORDER — TECHNETIUM TC 99M SESTAMIBI GENERIC - CARDIOLITE
29.7000 | Freq: Once | INTRAVENOUS | Status: AC | PRN
  Administered 2015-05-09: 30 via INTRAVENOUS

## 2015-05-09 MED ORDER — TECHNETIUM TC 99M SESTAMIBI GENERIC - CARDIOLITE
9.5000 | Freq: Once | INTRAVENOUS | Status: AC | PRN
  Administered 2015-05-09: 10 via INTRAVENOUS

## 2015-05-23 ENCOUNTER — Encounter: Payer: Self-pay | Admitting: Vascular Surgery

## 2015-05-30 ENCOUNTER — Encounter: Payer: Self-pay | Admitting: Vascular Surgery

## 2015-05-30 ENCOUNTER — Ambulatory Visit (INDEPENDENT_AMBULATORY_CARE_PROVIDER_SITE_OTHER)
Admission: RE | Admit: 2015-05-30 | Discharge: 2015-05-30 | Disposition: A | Discharge status: Home / Self Care | Payer: BLUE CROSS/BLUE SHIELD | Source: Ambulatory Visit | Attending: Vascular Surgery | Admitting: Vascular Surgery

## 2015-05-30 ENCOUNTER — Other Ambulatory Visit: Payer: Self-pay | Admitting: Vascular Surgery

## 2015-05-30 ENCOUNTER — Ambulatory Visit (HOSPITAL_COMMUNITY)
Admission: RE | Admit: 2015-05-30 | Discharge: 2015-05-30 | Disposition: A | Discharge status: Home / Self Care | Payer: BLUE CROSS/BLUE SHIELD | Source: Ambulatory Visit | Attending: Vascular Surgery | Admitting: Vascular Surgery

## 2015-05-30 ENCOUNTER — Ambulatory Visit (INDEPENDENT_AMBULATORY_CARE_PROVIDER_SITE_OTHER): Payer: BLUE CROSS/BLUE SHIELD | Admitting: Vascular Surgery

## 2015-05-30 VITALS — BP 139/93 | HR 57 | Ht 73.0 in | Wt 250.0 lb

## 2015-05-30 DIAGNOSIS — Z48812 Encounter for surgical aftercare following surgery on the circulatory system: Secondary | ICD-10-CM

## 2015-05-30 DIAGNOSIS — Z87891 Personal history of nicotine dependence: Secondary | ICD-10-CM | POA: Diagnosis not present

## 2015-05-30 DIAGNOSIS — I8002 Phlebitis and thrombophlebitis of superficial vessels of left lower extremity: Secondary | ICD-10-CM | POA: Diagnosis not present

## 2015-05-30 DIAGNOSIS — I739 Peripheral vascular disease, unspecified: Secondary | ICD-10-CM

## 2015-05-30 DIAGNOSIS — G8918 Other acute postprocedural pain: Secondary | ICD-10-CM | POA: Diagnosis not present

## 2015-05-30 DIAGNOSIS — E785 Hyperlipidemia, unspecified: Secondary | ICD-10-CM | POA: Diagnosis not present

## 2015-05-30 DIAGNOSIS — I10 Essential (primary) hypertension: Secondary | ICD-10-CM | POA: Insufficient documentation

## 2015-05-30 DIAGNOSIS — Z95828 Presence of other vascular implants and grafts: Secondary | ICD-10-CM | POA: Insufficient documentation

## 2015-05-30 NOTE — Addendum Note (Signed)
Addended by: Adria Dill L on: 05/30/2015 11:07 AM   Modules accepted: Orders

## 2015-05-30 NOTE — Progress Notes (Signed)
[facility] [place]  Patient name: Cory Woodward MRN: [id number] DOB: 1970-10-04 Sex: male  REASON FOR VISIT: Follow-up of left femoral to popliteal bypass.  HPI: Cory Woodward is a 45 y.o. male today for follow-up of left femoral-popliteal bypass on 02/10/2015. He presented with severe claudication and then had progression when he thrombosed his native superficial femoral artery. He had significant tibial disease and underwent tibial thrombectomy as well. He looks quite good today. He reports that he is back to his baseline. He reports that if he does a great deal of walking he can have some bilateral calf tightness. This is equal right to left and he is able to do his usual activities despite this. He has had no history of myocardial infarction this had a negative cardiac  Past Medical History  Diagnosis Date  . Hypertension   . Peripheral vascular disease (HCC)   . Chest pain     occasional  . Headache     No family history on file.  SOCIAL HISTORY: Social History  Substance Use Topics  . Smoking status: Former Smoker -- 25 years    Types: Cigarettes  . Smokeless tobacco: Not on file     Comment: using chantix  . Alcohol Use: 3.0 oz/week    5 Shots of liquor per week     Comment: many once  1 month- several drinks    Allergies  Allergen Reactions  . Peanut-Containing Drug Products Itching    Only has reaction to raw peanuts. No reaction with cooked peanuts, peanut butter, etc    Current Outpatient Prescriptions  Medication Sig Dispense Refill  . aspirin 81 MG chewable tablet Chew 1 tablet (81 mg total) by mouth daily. 30 tablet 3  . atorvastatin (LIPITOR) 40 MG tablet Take 1 tablet (40 mg total) by mouth daily. 30 tablet 6  . losartan-hydrochlorothiazide (HYZAAR) 100-25 MG tablet Take 1 tablet by mouth daily.    . Varenicline Tartrate (CHANTIX PO) Take by mouth.    . nicotine (NICODERM CQ - DOSED IN MG/24 HOURS) 14 mg/24hr patch Place 14 mg  onto the skin daily. Reported on 05/30/2015     No current facility-administered medications for this visit.    REVIEW OF SYSTEMS:   denotes positive finding,  denotes negative finding Cardiac  Comments:  Chest pain or chest pressure:    Shortness of breath upon exertion:    Short of breath when lying flat:    Irregular heart rhythm:        Vascular    Pain in calf, thigh, or hip brought on by ambulation:    Pain in feet at night that wakes you up from your sleep:     Blood clot in your veins:    Leg swelling:         Pulmonary    Oxygen at home:    Productive cough:     Wheezing:         Neurologic    Sudden weakness in arms or legs:     Sudden numbness in arms or legs:     Sudden onset of difficulty speaking or slurred speech:    Temporary loss of vision in one eye:     Problems with dizziness:         Gastrointestinal    Blood in stool:     Vomited blood:         Genitourinary    Burning when urinating:  Blood in urine:        Psychiatric    Major depression:         Hematologic    Bleeding problems:    Problems with blood clotting too easily:        Skin    Rashes or ulcers:        Constitutional    Fever or chills:      PHYSICAL EXAM: Filed Vitals:   05/30/15 0934  BP: 139/93  Pulse: 57  Height:  (1.854 m)  Weight: 250 lb (113.399 kg)  SpO2: 99%    GENERAL: The patient is a well-nourished male, in no acute distress. The vital signs are documented above.  VASCULAR: 2+ radial and 2+ right dorsalis pedis pulse. On the left he has an easily palpable popliteal graft pulse and 1+ posterior tibial pulse  MUSCULOSKELETAL: There are no major deformities or cyanosis. NEUROLOGIC: No focal weakness or paresthesias are detected. SKIN: There are no ulcers or rashes noted. PSYCHIATRIC: The patient has a normal affect.  DATA:  Reviewed his noninvasive studies. This shows triphasic waveforms in his posterior tibial bilaterally. He has monophasic  dorsalis pedis waveform on the left and triphasic on the right. Ankle arm index is normal at 1.0 bilaterally.  Skin reveals no evidence of stenosis throughout his vein femoral-popliteal bypass  MEDICAL ISSUES: Stable status post emergent femoropopliteal bypass for critical limb ischemia. He is quite well. He will continue his usual walking program. Fortunately he has completely stopped his smoking have congratulated him on this. He will discontinue his exercise program and we will see him again in 6 months with repeat noninvasive studies. I did discuss symptoms of graft occlusion he knows to notify should he should this occur No Follow-up on file.   Gretta Began [facility] [place]: [phone]

## 2015-06-08 ENCOUNTER — Other Ambulatory Visit (INDEPENDENT_AMBULATORY_CARE_PROVIDER_SITE_OTHER): Payer: BLUE CROSS/BLUE SHIELD | Admitting: *Deleted

## 2015-06-08 DIAGNOSIS — E785 Hyperlipidemia, unspecified: Secondary | ICD-10-CM

## 2015-06-08 LAB — LIPID PANEL
CHOL/HDL RATIO: 5.3 ratio — AB (ref <=5.0)
Cholesterol: 160 mg/dL (ref 125–200)
HDL: 30 mg/dL — AB (ref >=40)
LDL CALC: 99 mg/dL (ref <130)
Triglycerides: 156 mg/dL — ABNORMAL HIGH (ref <150)
VLDL: 31 mg/dL — ABNORMAL HIGH (ref <30)

## 2015-06-08 LAB — HEPATIC FUNCTION PANEL
ALBUMIN: 4.3 g/dL (ref 3.6–5.1)
ALT: 47 U/L — ABNORMAL HIGH (ref 9–46)
AST: 19 U/L (ref 10–40)
Alkaline Phosphatase: 51 U/L (ref 40–115)
BILIRUBIN TOTAL: 0.5 mg/dL (ref 0.2–1.2)
Bilirubin, Direct: 0.1 mg/dL (ref <=0.2)
Indirect Bilirubin: 0.4 mg/dL (ref 0.2–1.2)
Total Protein: 6.4 g/dL (ref 6.1–8.1)

## 2015-08-28 DIAGNOSIS — I10 Essential (primary) hypertension: Secondary | ICD-10-CM | POA: Diagnosis not present

## 2015-08-28 DIAGNOSIS — I739 Peripheral vascular disease, unspecified: Secondary | ICD-10-CM | POA: Diagnosis not present

## 2015-11-23 DIAGNOSIS — H6091 Unspecified otitis externa, right ear: Secondary | ICD-10-CM | POA: Diagnosis not present

## 2015-11-23 DIAGNOSIS — H9201 Otalgia, right ear: Secondary | ICD-10-CM | POA: Diagnosis not present

## 2015-12-01 ENCOUNTER — Encounter: Payer: Self-pay | Admitting: Family

## 2015-12-05 ENCOUNTER — Ambulatory Visit (INDEPENDENT_AMBULATORY_CARE_PROVIDER_SITE_OTHER): Payer: BLUE CROSS/BLUE SHIELD | Admitting: Family

## 2015-12-05 ENCOUNTER — Encounter: Payer: Self-pay | Admitting: Family

## 2015-12-05 ENCOUNTER — Ambulatory Visit (HOSPITAL_COMMUNITY)
Admission: RE | Admit: 2015-12-05 | Discharge: 2015-12-05 | Disposition: A | Discharge status: Home / Self Care | Payer: BLUE CROSS/BLUE SHIELD | Source: Ambulatory Visit | Attending: Family | Admitting: Family

## 2015-12-05 ENCOUNTER — Ambulatory Visit (INDEPENDENT_AMBULATORY_CARE_PROVIDER_SITE_OTHER)
Admission: RE | Admit: 2015-12-05 | Discharge: 2015-12-05 | Disposition: A | Discharge status: Home / Self Care | Payer: BLUE CROSS/BLUE SHIELD | Source: Ambulatory Visit | Attending: Family | Admitting: Family

## 2015-12-05 VITALS — BP 136/87 | HR 73 | Temp 97.6°F | Resp 20 | Ht 73.0 in | Wt 258.0 lb

## 2015-12-05 DIAGNOSIS — I739 Peripheral vascular disease, unspecified: Secondary | ICD-10-CM | POA: Insufficient documentation

## 2015-12-05 DIAGNOSIS — Z95828 Presence of other vascular implants and grafts: Secondary | ICD-10-CM | POA: Diagnosis not present

## 2015-12-05 DIAGNOSIS — R0989 Other specified symptoms and signs involving the circulatory and respiratory systems: Secondary | ICD-10-CM | POA: Diagnosis present

## 2015-12-05 DIAGNOSIS — I779 Disorder of arteries and arterioles, unspecified: Secondary | ICD-10-CM | POA: Diagnosis not present

## 2015-12-05 DIAGNOSIS — Z87891 Personal history of nicotine dependence: Secondary | ICD-10-CM

## 2015-12-05 NOTE — Patient Instructions (Signed)
Peripheral Vascular Disease Peripheral vascular disease (PVD) is a disease of the blood vessels that are not part of your heart and brain. A simple term for PVD is poor circulation. In most cases, PVD narrows the blood vessels that carry blood from your heart to the rest of your body. This can result in a decreased supply of blood to your arms, legs, and internal organs, like your stomach or kidneys. However, it most often affects a person's lower legs and feet. There are two types of PVD.  Organic PVD. This is the more common type. It is caused by damage to the structure of blood vessels.  Functional PVD. This is caused by conditions that make blood vessels contract and tighten (spasm). Without treatment, PVD tends to get worse over time. PVD can also lead to acute ischemic limb. This is when an arm or limb suddenly has trouble getting enough blood. This is a medical emergency. CAUSES Each type of PVD has many different causes. The most common cause of PVD is buildup of a fatty material (plaque) inside of your arteries (atherosclerosis). Small amounts of plaque can break off from the walls of the blood vessels and become lodged in a smaller artery. This blocks blood flow and can cause acute ischemic limb. Other common causes of PVD include:  Blood clots that form inside of blood vessels.  Injuries to blood vessels.  Diseases that cause inflammation of blood vessels or cause blood vessel spasms.  Health behaviors and health history that increase your risk of developing PVD. RISK FACTORS  You may have a greater risk of PVD if you:  Have a family history of PVD.  Have certain medical conditions, including:  High cholesterol.  Diabetes.  High blood pressure (hypertension).  Coronary heart disease.  Past problems with blood clots.  Past injury, such as burns or a broken bone. These may have damaged blood vessels in your limbs.  Buerger disease. This is caused by inflamed blood  vessels in your hands and feet.  Some forms of arthritis.  Rare birth defects that affect the arteries in your legs.  Use tobacco.  Do not get enough exercise.  Are obese.  Are age 50 or older. SIGNS AND SYMPTOMS  PVD may cause many different symptoms. Your symptoms depend on what part of your body is not getting enough blood. Some common signs and symptoms include:  Cramps in your lower legs. This may be a symptom of poor leg circulation (claudication).  Pain and weakness in your legs while you are physically active that goes away when you rest (intermittent claudication).  Leg pain when at rest.  Leg numbness, tingling, or weakness.  Coldness in a leg or foot, especially when compared with the other leg.  Skin or hair changes. These can include:  Hair loss.  Shiny skin.  Pale or bluish skin.  Thick toenails.  Inability to get or maintain an erection (erectile dysfunction). People with PVD are more prone to developing ulcers and sores on their toes, feet, or legs. These may take longer than normal to heal. DIAGNOSIS Your health care provider may diagnose PVD from your signs and symptoms. The health care provider will also do a physical exam. You may have tests to find out what is causing your PVD and determine its severity. Tests may include:  Blood pressure recordings from your arms and legs and measurements of the strength of your pulses (pulse volume recordings).  Imaging studies using sound waves to take pictures of   the blood flow through your blood vessels (Doppler ultrasound).  Injecting a dye into your blood vessels before having imaging studies using:  X-rays (angiogram or arteriogram).  Computer-generated X-rays (CT angiogram).  A powerful electromagnetic field and a computer (magnetic resonance angiogram or MRA). TREATMENT Treatment for PVD depends on the cause of your condition and the severity of your symptoms. It also depends on your age. Underlying  causes need to be treated and controlled. These include long-lasting (chronic) conditions, such as diabetes, high cholesterol, and high blood pressure. You may need to first try making lifestyle changes and taking medicines. Surgery may be needed if these do not work. Lifestyle changes may include:  Quitting smoking.  Exercising regularly.  Following a low-fat, low-cholesterol diet. Medicines may include:  Blood thinners to prevent blood clots.  Medicines to improve blood flow.  Medicines to improve your blood cholesterol levels. Surgical procedures may include:  A procedure that uses an inflated balloon to open a blocked artery and improve blood flow (angioplasty).  A procedure to put in a tube (stent) to keep a blocked artery open (stent implant).  Surgery to reroute blood flow around a blocked artery (peripheral bypass surgery).  Surgery to remove dead tissue from an infected wound on the affected limb.  Amputation. This is surgical removal of the affected limb. This may be necessary in cases of acute ischemic limb that are not improved through medical or surgical treatments. HOME CARE INSTRUCTIONS  Take medicines only as directed by your health care provider.  Do not use any tobacco products, including cigarettes, chewing tobacco, or electronic cigarettes. If you need help quitting, ask your health care provider.  Lose weight if you are overweight, and maintain a healthy weight as directed by your health care provider.  Eat a diet that is low in fat and cholesterol. If you need help, ask your health care provider.  Exercise regularly. Ask your health care provider to suggest some good activities for you.  Use compression stockings or other mechanical devices as directed by your health care provider.  Take good care of your feet.  Wear comfortable shoes that fit well.  Check your feet often for any cuts or sores. SEEK MEDICAL CARE IF:  You have cramps in your legs  while walking.  You have leg pain when you are at rest.  You have coldness in a leg or foot.  Your skin changes.  You have erectile dysfunction.  You have cuts or sores on your feet that are not healing. SEEK IMMEDIATE MEDICAL CARE IF:  Your arm or leg turns cold and blue.  Your arms or legs become red, warm, swollen, painful, or numb.  You have chest pain or trouble breathing.  You suddenly have weakness in your face, arm, or leg.  You become very confused or lose the ability to speak.  You suddenly have a very bad headache or lose your vision.   This information is not intended to replace advice given to you by your health care provider. Make sure you discuss any questions you have with your health care provider.   Document Released: 05/02/2004 Document Revised: 04/15/2014 Document Reviewed: 09/02/2013 Elsevier Interactive Patient Education 2016 Elsevier Inc.  

## 2015-12-05 NOTE — Progress Notes (Signed)
[facility] [place]   CC: Follow up peripheral artery occlusive disease  History of Present Illness Cory Woodward is a 45 y.o. male patient of [name] returns today for follow-up s/p left femoral-popliteal bypass on 02/10/2015. He presented with severe claudication and then had progression when he thrombosed his native superficial femoral artery. He had significant tibial disease and underwent tibial thrombectomy as well. He has had no history of myocardial infarction,  had a negative cardiac stress test.  He denies claudication in his legs with walking, denies non healing wounds. He denies any history of stroke or TIA.   He states that he snores, but does not wake himself or his wife up with this.   Pt Diabetic: No Pt smoker: former smoker, quit March 2017, started at age 45 years   Pt meds include: Statin :Yes Betablocker: No ASA: Yes Other anticoagulants/antiplatelets: no  Past Medical History:  Diagnosis Date  . Chest pain    occasional  . Headache   . Hypertension   . Peripheral vascular disease [facility](HCC)     Social History Social History  Substance Use Topics  . Smoking status: Former Smoker    Years: 25.00    Types: Cigarettes  . Smokeless tobacco: Not on file     Comment: using chantix  . Alcohol use 3.0 oz/week    5 Shots of liquor per week     Comment: many once  1 month- several drinks    Family History No family history on file.  Past Surgical History:  Procedure Laterality Date  . ARTERY EXPLORATION Left 02/10/2015   Procedure: LEFT BELOW KNEE POPLITEAL ARTERY EXPLORATION;  Surgeon: Larina Earthlyodd F Early, MD;  [facility];  Service: Vascular;  Laterality: Left;  . EMBOLECTOMY Left 02/10/2015   Procedure: LEFT ANTERIOR TIBIAL, POSTERIOR TIBIAL AND PERONEAL ARTERY EMBOLECTOMY;  Surgeon: Larina Earthlyodd F Early, MD;  [facility];  Service: Vascular;  Laterality: Left;  . FEMORAL-POPLITEAL BYPASS GRAFT Left 02/10/2015   Procedure: LEFT  FEMORAL-ABOVE KNEE POPLITEAL ARTERY BYPASS GRAFT;  Surgeon: Larina Earthlyodd F Early, MD;  [facility];  Service: Vascular;  Laterality: Left;  . FINGER TENDON REPAIR Right    middle  . INTRAOPERATIVE ARTERIOGRAM Left 02/10/2015   Procedure: INTRA OPERATIVE ARTERIOGRAM;  Surgeon: Larina Earthlyodd F Early, MD;  [facility];  Service: Vascular;  Laterality: Left;  . PERIPHERAL VASCULAR CATHETERIZATION N/A 02/08/2015   Procedure: Abdominal Aortogram;  Surgeon: Larina Earthlyodd F Early, MD;  [facility];  Service: Cardiovascular;  Laterality: N/A;    Allergies  Allergen Reactions  . Peanut-Containing Drug Products Itching    Only has reaction to "RAW PEANUTS". No reaction with cooked peanuts, peanut butter, etc    Current Outpatient Prescriptions  Medication Sig Dispense Refill  . aspirin 81 MG chewable tablet Chew 1 tablet (81 mg total) by mouth daily. 30 tablet 3  . atorvastatin (LIPITOR) 40 MG tablet Take 1 tablet (40 mg total) by mouth daily. 30 tablet 6  . losartan-hydrochlorothiazide (HYZAAR) 100-25 MG tablet Take 1 tablet by mouth daily.     No current facility-administered medications for this visit.     ROS: See HPI for pertinent positives and negatives.   Physical Examination  Vitals:   12/05/15 1129  BP: 136/87  Pulse: 73  Resp: 20  Temp: 97.6 F (36.4 C)  SpO2: 98%  Weight: 258 lb (117 kg)  Height: 6\' 1"  (1.854 m)   Body mass index is 34.04 kg/m.  General: A&O x 3, WDWN, obese male, large neck. Gait: normal Eyes: PERRLA. Pulmonary: Respirations are non labored, CTAB, without wheezes, rales, or rhonchi. Cardiac: regular rythm, no detected murmur.         Carotid Bruits Right Left   Negative Positive  Aorta is not palpable. Radial pulses: 2+ right, 1+ left palpable                           VASCULAR EXAM: Extremities without ischemic changes,  without Gangrene; without open wounds.                                                                                                           LE Pulses Right Left       FEMORAL  1+ palpable  2+ palpable        POPLITEAL  not palpable   not palpable       POSTERIOR TIBIAL  1+ palpable   not palpable        DORSALIS PEDIS      ANTERIOR TIBIAL 2+ palpable  1+ palpable    Abdomen: soft, NT, no palpable masses. Skin: no rashes, no ulcers. Musculoskeletal: no muscle wasting or atrophy.  Neurologic: A&O X 3; Appropriate Affect ; SENSATION: normal; MOTOR FUNCTION:  moving all extremities equally, motor strength 5/5 throughout. Speech is fluent/normal. CN 2-12 intact.    ASSESSMENT: Cory Woodward is a 45 y.o. male who is s/p left femoral-popliteal bypass on 02/10/2015 for severe claudication which has resolved since the surgery.  He walks a great deal on his job.   He quit smoking in March 2017, started at age 39. Fortunately he does not have DM. His atherosclerotic risk factors include former smoker and obesity.    DATA Left LE arterial duplex suggests left LE arterial bypass with no restenosis.   ABI's are normal bilaterally with triphasic waveforms on the right, biphasic (PT) and triphasic (DP) waveforms on the left.   Left carotid bruit: carotid duplex in January 2017: Normal carotid arteries, bilaterally. Normal subclavian arteries, bilaterally. Patent vertebral arteries with antegrade flow.  PLAN:  Continue graduated walking program.  Based on the patient's vascular studies and examination, pt will return to clinic in 6 months with ABI's and left LE arterial duplex.   I discussed in depth with the patient the nature of atherosclerosis, and emphasized the importance of maximal medical management including strict control of blood pressure, blood glucose, and lipid levels, obtaining regular exercise, and continued cessation of smoking.  The patient is aware that without maximal medical management the underlying atherosclerotic disease process will progress, limiting the benefit of any  interventions.  The patient was given information about PAD including signs, symptoms, treatment, what symptoms should prompt the patient to seek immediate medical care, and risk reduction measures to take.  Charisse March, RN, MSN, FNP-C [facility] [place] Phone: [phone]  Clinic MD: Early  12/05/15 11:52 AM

## 2016-03-06 DIAGNOSIS — I10 Essential (primary) hypertension: Secondary | ICD-10-CM | POA: Diagnosis not present

## 2016-03-06 DIAGNOSIS — Z6836 Body mass index (BMI) 36.0-36.9, adult: Secondary | ICD-10-CM | POA: Diagnosis not present

## 2016-03-06 DIAGNOSIS — E6609 Other obesity due to excess calories: Secondary | ICD-10-CM | POA: Diagnosis not present

## 2016-03-06 DIAGNOSIS — I739 Peripheral vascular disease, unspecified: Secondary | ICD-10-CM | POA: Diagnosis not present

## 2016-03-08 NOTE — Addendum Note (Signed)
Addended by: Burton ApleyPETTY, Chaselynn Kepple A on: 03/08/2016 03:35 PM   Modules accepted: Orders

## 2016-06-18 ENCOUNTER — Ambulatory Visit: Payer: BLUE CROSS/BLUE SHIELD | Admitting: Family

## 2016-06-18 ENCOUNTER — Other Ambulatory Visit (HOSPITAL_COMMUNITY): Payer: BLUE CROSS/BLUE SHIELD

## 2016-06-18 ENCOUNTER — Encounter (HOSPITAL_COMMUNITY): Payer: BLUE CROSS/BLUE SHIELD

## 2016-07-15 ENCOUNTER — Encounter: Payer: Self-pay | Admitting: Family

## 2016-07-23 ENCOUNTER — Encounter: Payer: Self-pay | Admitting: Cardiology

## 2016-07-25 ENCOUNTER — Ambulatory Visit (INDEPENDENT_AMBULATORY_CARE_PROVIDER_SITE_OTHER)
Admission: RE | Admit: 2016-07-25 | Discharge: 2016-07-25 | Disposition: A | Discharge status: Home / Self Care | Payer: 59 | Source: Ambulatory Visit | Attending: Family | Admitting: Family

## 2016-07-25 ENCOUNTER — Ambulatory Visit (HOSPITAL_COMMUNITY)
Admission: RE | Admit: 2016-07-25 | Discharge: 2016-07-25 | Disposition: A | Discharge status: Home / Self Care | Payer: 59 | Source: Ambulatory Visit | Attending: Family | Admitting: Family

## 2016-07-25 ENCOUNTER — Ambulatory Visit (INDEPENDENT_AMBULATORY_CARE_PROVIDER_SITE_OTHER): Payer: BLUE CROSS/BLUE SHIELD | Admitting: Family

## 2016-07-25 ENCOUNTER — Encounter: Payer: Self-pay | Admitting: Family

## 2016-07-25 VITALS — BP 115/83 | HR 68 | Temp 98.0°F | Resp 18 | Ht 73.0 in | Wt 248.0 lb

## 2016-07-25 DIAGNOSIS — Z87891 Personal history of nicotine dependence: Secondary | ICD-10-CM

## 2016-07-25 DIAGNOSIS — I779 Disorder of arteries and arterioles, unspecified: Secondary | ICD-10-CM

## 2016-07-25 DIAGNOSIS — Z95828 Presence of other vascular implants and grafts: Secondary | ICD-10-CM | POA: Diagnosis not present

## 2016-07-25 NOTE — Progress Notes (Signed)
[facility] [place]   CC: Follow up peripheral artery occlusive disease  History of Present Illness Cory Woodward is a 46 y.o. male patient of Dr. Arbie Cookey returns today for follow-up s/p left femoral-popliteal bypass on 02/10/2015. He presented with severe claudication and then had progression when he thrombosed his native superficial femoral artery. He had significant tibial disease and underwent tibial thrombectomy as well. He has had no history of myocardial infarction,  had a negative cardiac stress test.  He denies claudication in his legs with walking, denies non healing wounds. 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 In the last couple of months he has noticed the tip pf his left great toe tingling when he drives; this resolves when he moves his legs while driving. He states he tends to cross the left leg over the right when he drives.  He does walk a great deal on this job also, he brings cars to auctions, loads and unloads them.   He denies any history of stroke or TIA.   He states that he snores, but does not wake himself or his wife up with this.   Pt Diabetic: No Pt smoker: former smoker, quit March 2017, started at age 32 years   Pt meds include: Statin :Yes Betablocker: No ASA: Yes Other anticoagulants/antiplatelets: no     Past Medical History:  Diagnosis Date  . Chest pain    occasional  . Headache   . Hypertension   . Peripheral vascular disease [facility])     Social History Social History  Substance Use Topics  . Smoking status: Former Smoker    Years: 25.00    Types: Cigarettes  . Smokeless tobacco: Never Used     Comment: using chantix  . Alcohol use 3.0 oz/week    5 Shots of liquor per week     Comment: many once  1 month- several drinks    Family History No family history on file.  Past Surgical History:  Procedure Laterality Date  . ARTERY EXPLORATION Left 02/10/2015   Procedure: LEFT BELOW KNEE POPLITEAL ARTERY  EXPLORATION;  Surgeon: Larina Earthly, MD;  Location: [facility] OR;  Service: Vascular;  Laterality: Left;  . EMBOLECTOMY Left 02/10/2015   Procedure: LEFT ANTERIOR TIBIAL, POSTERIOR TIBIAL AND PERONEAL ARTERY EMBOLECTOMY;  Surgeon: Larina Earthly, MD;  Location: [facility] OR;  Service: Vascular;  Laterality: Left;  . FEMORAL-POPLITEAL BYPASS GRAFT Left 02/10/2015   Procedure: LEFT FEMORAL-ABOVE KNEE POPLITEAL ARTERY BYPASS GRAFT;  Surgeon: Larina Earthly, MD;  Location: [facility] OR;  Service: Vascular;  Laterality: Left;  . FINGER TENDON REPAIR Right    middle  . INTRAOPERATIVE ARTERIOGRAM Left 02/10/2015   Procedure: INTRA OPERATIVE ARTERIOGRAM;  Surgeon: Larina Earthly, MD;  Location: [facility] OR;  Service: Vascular;  Laterality: Left;  . PERIPHERAL VASCULAR CATHETERIZATION N/A 02/08/2015   Procedure: Abdominal Aortogram;  Surgeon: Larina Earthly, MD;  Location: [facility] INVASIVE CV LAB;  Service: Cardiovascular;  Laterality: N/A;    Allergies  Allergen Reactions  . Peanut-Containing Drug Products Itching    Only has reaction to "RAW PEANUTS". No reaction with cooked peanuts, peanut butter, etc    Current Outpatient Prescriptions  Medication Sig Dispense Refill  . aspirin 81 MG chewable tablet Chew 1 tablet (81 mg total) by mouth daily. 30 tablet 3  . atorvastatin (LIPITOR) 40 MG tablet Take 1 tablet (40 mg total) by mouth daily. 30 tablet 6  . losartan-hydrochlorothiazide (HYZAAR) 100-25 MG tablet  Take 1 tablet by mouth daily.     No current facility-administered medications for this visit.     ROS: See HPI for pertinent positives and negatives.   Physical Examination  Vitals:   07/25/16 1131  BP: 115/83  Pulse: 68  Resp: 18  Temp: 98 F (36.7 C)  SpO2: 98%  Weight: 248 lb (112.5 kg)  Height:  (1.854 m)   Body mass index is 32.72 kg/m.  General: A&O x 3, WDWN, obese male, large neck. Gait: normal Eyes: PERRLA. Pulmonary: Respirations are non labored, CTAB, without wheezes, rales, or rhonchi. Cardiac:  regular rythm, no detected murmur.         Carotid Bruits Right Left   Negative Positive  Aorta is not palpable. Radial pulses: 2+ right, 1+ left palpable                           VASCULAR EXAM: Extremities without ischemic changes,  without Gangrene; without open wounds.                                                                                                                                                       LE Pulses Right Left       FEMORAL  1+ palpable  2+ palpable        POPLITEAL  not palpable   not palpable       POSTERIOR TIBIAL  1+ palpable   not palpable        DORSALIS PEDIS      ANTERIOR TIBIAL 2+ palpable  Not palpable    Abdomen: soft, NT, no palpable masses. Skin: no rashes, no ulcers. Musculoskeletal: no muscle wasting or atrophy.         Neurologic: A&O X 3; Appropriate Affect ; SENSATION: normal; MOTOR FUNCTION:  moving all extremities equally, motor strength 5/5 throughout. Speech is fluent/normal. CN 2-12 intact.     ASSESSMENT: Cory Woodward is a 46 y.o. male who is s/p left femoral-popliteal bypass on 02/10/2015 for severe claudication which has resolved since the surgery.  He walks a great deal on his job.   He quit smoking in March 2017, started at age 41. Fortunately he does not have DM. His atherosclerotic risk factors include former smoker and obesity.  He takes a daily statin and ASA.   I discussed with Dr. Darrick Penna the 20% decline in the left LE ABI, with minimal to no claudication symptoms, and no significant stenosis of the left LE arterial bypass graft.   DATA  Left LE arterial duplex today suggests elevated velocities at the proximal bypass anastomosis (189 cm/s) with no  In the bypass graft restenosis.   No significant change compared to the last exam on 12-05-15.  ABI today Right: 1.08 (1.14, 12-05-15), waveforms:  triphasic; TBI: 0.91 Left: 0.76 (0.96, 12-05-15), waveforms: PT: biphasic, DP: triphasic; TBI: 0.65  (not documented on 12-05-15) Stable and normal right ABI, left ABI declined by 20% from a year ago.    Left Carotid bruit: Carotid duplex in January 2017: Normal carotid arteries, bilaterally. Normal subclavian arteries, bilaterally. Patent vertebral arteries with antegrade flow.  PLAN:  Continue graduated walking program.  Based on the patient's vascular studies and examination, and after discussing with Dr. Darrick Penna, pt will return to clinic in 6 months with ABI's and left LE arterial duplex.   I advised Mr. Mah to notify us if he develops concerns re the circulation in his feet/legs.   I discussed in depth with the patient the nature of atherosclerosis, and emphasized the importance of maximal medical management including strict control of blood pressure, blood glucose, and lipid levels, obtaining regular exercise, and continued cessation of smoking.  The patient is aware that without maximal medical management the underlying atherosclerotic disease process will progress, limiting the benefit of any interventions.  The patient was given information about PAD including signs, symptoms, treatment, what symptoms should prompt the patient to seek immediate medical care, and risk reduction measures to take.  Charisse March, RN, MSN, FNP-C [facility] [place] Phone: [phone]  Clinic MD: [facility]  07/25/16 11:53 AM

## 2016-07-25 NOTE — Patient Instructions (Signed)

## 2016-07-26 NOTE — Addendum Note (Signed)
Addended by: Burton Apley A on: 07/26/2016 11:59 AM   Modules accepted: Orders

## 2017-01-30 ENCOUNTER — Ambulatory Visit (HOSPITAL_COMMUNITY)
Admission: RE | Admit: 2017-01-30 | Discharge: 2017-01-30 | Disposition: A | Discharge status: Home / Self Care | Payer: BLUE CROSS/BLUE SHIELD | Source: Ambulatory Visit | Attending: Family | Admitting: Family

## 2017-01-30 ENCOUNTER — Ambulatory Visit (INDEPENDENT_AMBULATORY_CARE_PROVIDER_SITE_OTHER)
Admission: RE | Admit: 2017-01-30 | Discharge: 2017-01-30 | Disposition: A | Discharge status: Home / Self Care | Payer: BLUE CROSS/BLUE SHIELD | Source: Ambulatory Visit | Attending: Family | Admitting: Family

## 2017-01-30 ENCOUNTER — Encounter: Payer: Self-pay | Admitting: Family

## 2017-01-30 ENCOUNTER — Ambulatory Visit (INDEPENDENT_AMBULATORY_CARE_PROVIDER_SITE_OTHER): Payer: BLUE CROSS/BLUE SHIELD | Admitting: Family

## 2017-01-30 VITALS — BP 153/99 | HR 60 | Temp 98.0°F | Resp 20 | Ht 73.0 in | Wt 252.0 lb

## 2017-01-30 DIAGNOSIS — Z95828 Presence of other vascular implants and grafts: Secondary | ICD-10-CM

## 2017-01-30 DIAGNOSIS — I779 Disorder of arteries and arterioles, unspecified: Secondary | ICD-10-CM | POA: Insufficient documentation

## 2017-01-30 DIAGNOSIS — F17200 Nicotine dependence, unspecified, uncomplicated: Secondary | ICD-10-CM

## 2017-01-30 DIAGNOSIS — I743 Embolism and thrombosis of arteries of the lower extremities: Secondary | ICD-10-CM | POA: Diagnosis not present

## 2017-01-30 NOTE — Patient Instructions (Addendum)
Steps to Quit Smoking Smoking tobacco can be bad for your health. It can also affect almost every organ in your body. Smoking puts you and people around you at risk for many serious long-lasting (chronic) diseases. Quitting smoking is hard, but it is one of the best things that you can do for your health. It is never too late to quit. What are the benefits of quitting smoking? When you quit smoking, you lower your risk for getting serious diseases and conditions. They can include:  Lung cancer or lung disease.  Heart disease.  Stroke.  Heart attack.  Not being able to have children (infertility).  Weak bones (osteoporosis) and broken bones (fractures).  If you have coughing, wheezing, and shortness of breath, those symptoms may get better when you quit. You may also get sick less often. If you are pregnant, quitting smoking can help to lower your chances of having a baby of low birth weight. What can I do to help me quit smoking? Talk with your doctor about what can help you quit smoking. Some things you can do (strategies) include:  Quitting smoking totally, instead of slowly cutting back how much you smoke over a period of time.  Going to in-person counseling. You are more likely to quit if you go to many counseling sessions.  Using resources and support systems, such as: ? Online chats with a counselor. ? Phone quitlines. ? Printed self-help materials. ? Support groups or group counseling. ? Text messaging programs. ? Mobile phone apps or applications.  Taking medicines. Some of these medicines may have nicotine in them. If you are pregnant or breastfeeding, do not take any medicines to quit smoking unless your doctor says it is okay. Talk with your doctor about counseling or other things that can help you.  Talk with your doctor about using more than one strategy at the same time, such as taking medicines while you are also going to in-person counseling. This can help make  quitting easier. What things can I do to make it easier to quit? Quitting smoking might feel very hard at first, but there is a lot that you can do to make it easier. Take these steps:  Talk to your family and friends. Ask them to support and encourage you.  Call phone quitlines, reach out to support groups, or work with a counselor.  Ask people who smoke to not smoke around you.  Avoid places that make you want (trigger) to smoke, such as: ? Bars. ? Parties. ? Smoke-break areas at work.  Spend time with people who do not smoke.  Lower the stress in your life. Stress can make you want to smoke. Try these things to help your stress: ? Getting regular exercise. ? Deep-breathing exercises. ? Yoga. ? Meditating. ? Doing a body scan. To do this, close your eyes, focus on one area of your body at a time from head to toe, and notice which parts of your body are tense. Try to relax the muscles in those areas.  Download or buy apps on your mobile phone or tablet that can help you stick to your quit plan. There are many free apps, such as QuitGuide from the CDC (Centers for Disease Control and Prevention). You can find more support from smokefree.gov and other websites.  This information is not intended to replace advice given to you by your health care provider. Make sure you discuss any questions you have with your health care provider. Document Released: 01/19/2009 Document   Revised: 11/21/2015 Document Reviewed: 08/09/2014 Elsevier Interactive Patient Education  2018 Elsevier Inc.     Peripheral Vascular Disease Peripheral vascular disease (PVD) is a disease of the blood vessels that are not part of your heart and brain. A simple term for PVD is poor circulation. In most cases, PVD narrows the blood vessels that carry blood from your heart to the rest of your body. This can result in a decreased supply of blood to your arms, legs, and internal organs, like your stomach or kidneys.  However, it most often affects a person's lower legs and feet. There are two types of PVD.  Organic PVD. This is the more common type. It is caused by damage to the structure of blood vessels.  Functional PVD. This is caused by conditions that make blood vessels contract and tighten (spasm).  Without treatment, PVD tends to get worse over time. PVD can also lead to acute ischemic limb. This is when an arm or limb suddenly has trouble getting enough blood. This is a medical emergency. Follow these instructions at home:  Take medicines only as told by your doctor.  Do not use any tobacco products, including cigarettes, chewing tobacco, or electronic cigarettes. If you need help quitting, ask your doctor.  Lose weight if you are overweight, and maintain a healthy weight as told by your doctor.  Eat a diet that is low in fat and cholesterol. If you need help, ask your doctor.  Exercise regularly. Ask your doctor for some good activities for you.  Take good care of your feet. ? Wear comfortable shoes that fit well. ? Check your feet often for any cuts or sores. Contact a doctor if:  You have cramps in your legs while walking.  You have leg pain when you are at rest.  You have coldness in a leg or foot.  Your skin changes.  You are unable to get or have an erection (erectile dysfunction).  You have cuts or sores on your feet that are not healing. Get help right away if:  Your arm or leg turns cold and blue.  Your arms or legs become red, warm, swollen, painful, or numb.  You have chest pain or trouble breathing.  You suddenly have weakness in your face, arm, or leg.  You become very confused or you cannot speak.  You suddenly have a very bad headache.  You suddenly cannot see. This information is not intended to replace advice given to you by your health care provider. Make sure you discuss any questions you have with your health care provider. Document Released:  06/19/2009 Document Revised: 08/31/2015 Document Reviewed: 09/02/2013 Elsevier Interactive Patient Education  2017 Elsevier Inc.  

## 2017-01-30 NOTE — Progress Notes (Signed)
[facility] [place]   CC: Follow up peripheral artery occlusive disease  History of Present Illness Cory Woodward is a 46 y.o. male returns today for follow-up s/pleft femoral-popliteal bypass on 02/10/2015 by Dr. Arbie Cookey. He presented with severe claudication and then had progression when he thrombosed his native superficial femoral artery. He had significant tibial disease and underwent tibial thrombectomy as well. He has had no history of myocardial infarction, had a negative cardiac stress test.  He reports tired feeling in both calves equally after walking, resolves after a few seconds, denies non healing wounds. 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 In the last couple of months he has noticed the tip pf his left great toe tingling when he drives; this resolves when he moves his legs while driving. He states he tends to cross the left leg over the right when he drives.  He does walk a great deal on this job also, he brings cars to auctions, loads and unloads them.  He walks 2-3 miles daily in the course of his work day.   He denies any history of stroke or TIA.   He states that he snores, but does not wake himself or his wife up with this.   Pt Diabetic: No Pt smoker:current smoker, quits occassionally, started at age 71years   Pt meds include: Statin :Yes Betablocker: No ASA: Yes Other anticoagulants/antiplatelets: no    Past Medical History:  Diagnosis Date  . Chest pain    occasional  . Headache   . Hypertension   . Peripheral vascular disease [facility])     Social History Social History  Substance Use Topics  . Smoking status: Current Some Day Smoker    Types: Cigarettes  . Smokeless tobacco: Never Used  . Alcohol use 3.0 oz/week    5 Shots of liquor per week     Comment: many once  1 month- several drinks    Family History No family history on file.  Past Surgical History:  Procedure Laterality Date  . ARTERY EXPLORATION Left  02/10/2015   Procedure: LEFT BELOW KNEE POPLITEAL ARTERY EXPLORATION;  Surgeon: Larina Earthly, MD;  Location: [facility] OR;  Service: Vascular;  Laterality: Left;  . EMBOLECTOMY Left 02/10/2015   Procedure: LEFT ANTERIOR TIBIAL, POSTERIOR TIBIAL AND PERONEAL ARTERY EMBOLECTOMY;  Surgeon: Larina Earthly, MD;  Location: [facility] OR;  Service: Vascular;  Laterality: Left;  . FEMORAL-POPLITEAL BYPASS GRAFT Left 02/10/2015   Procedure: LEFT FEMORAL-ABOVE KNEE POPLITEAL ARTERY BYPASS GRAFT;  Surgeon: Larina Earthly, MD;  Location: [facility] OR;  Service: Vascular;  Laterality: Left;  . FINGER TENDON REPAIR Right    middle  . INTRAOPERATIVE ARTERIOGRAM Left 02/10/2015   Procedure: INTRA OPERATIVE ARTERIOGRAM;  Surgeon: Larina Earthly, MD;  Location: [facility] OR;  Service: Vascular;  Laterality: Left;  . PERIPHERAL VASCULAR CATHETERIZATION N/A 02/08/2015   Procedure: Abdominal Aortogram;  Surgeon: Larina Earthly, MD;  Location: [facility] INVASIVE CV LAB;  Service: Cardiovascular;  Laterality: N/A;    Allergies  Allergen Reactions  . Peanut-Containing Drug Products Itching    Only has reaction to "RAW PEANUTS". No reaction with cooked peanuts, peanut butter, etc    Current Outpatient Prescriptions  Medication Sig Dispense Refill  . aspirin 81 MG chewable tablet Chew 1 tablet (81 mg total) by mouth daily. 30 tablet 3  . atorvastatin (LIPITOR) 40 MG tablet Take 1 tablet (40 mg total) by mouth daily. 30 tablet 6  . losartan-hydrochlorothiazide (HYZAAR)  100-25 MG tablet Take 1 tablet by mouth daily.     No current facility-administered medications for this visit.     ROS: See HPI for pertinent positives and negatives.   Physical Examination  Vitals:   01/30/17 1052 01/30/17 1059  BP: (!) 129/91 (!) 153/99  Pulse: 60   Resp: 20   Temp: 98 F (36.7 C)   TempSrc: Oral   SpO2: 96%   Weight: 252 lb (114.3 kg)   Height: 6\' 1"  (1.854 m)    Body mass index is 33.25 kg/m.  General: A&O x 3, WDWN, obese male, large neck. Gait:  normal Eyes: PERRLA. Pulmonary: Respirations are non labored, CTAB, without wheezes, rales,or rhonchi. Cardiac: regular rythm, nodetected murmur.    Carotid Bruits Right Left   Negative Positive   Abdominal aortic pulse is notpalpable. Radial pulses: 2+ right, 1+ left palpable  VASCULAR EXAM: Extremitieswithoutischemic changes,  withoutGangrene; withoutopen wounds.  LE Pulses Right Left  FEMORAL 2+palpable 2+palpable   POPLITEAL notpalpable  notpalpable  POSTERIOR TIBIAL 1+palpable  notpalpable   DORSALIS PEDIS ANTERIOR TIBIAL 2+palpable  Not palpable    Abdomen: soft, NT, no palpable masses. Skin: no rashes, no ulcers. Musculoskeletal: no muscle wasting or atrophy. Neurologic: A&O X 3; Appropriate Affect ; SENSATION: normal; MOTOR FUNCTION: moving all extremities equally, motor strength 5/5 throughout. Speech is fluent/loud. CN 2-12 intact.   ASSESSMENT: Cory Woodward is a 46 y.o. male who is s/pleft femoral-popliteal bypass on 02/10/2015 for severe claudication which has resolved since the surgery.   He continues to smoke intermittently, started at age 2. Fortunately he does not have DM. His atherosclerotic risk factors include former smoker and obesity.  He takes a daily statin and ASA.  Fortunately he walks 2-3 miles daily at his job, develops bilateral calf tired feeling after 1/2 mile. No signs of ischemia in his feet or legs.     DATA  Left LE arterial duplex(01/30/17): Left leg bypass graft with no evidence of stenosis. No significant change compared to the last exam on 07-25-16.   ABI (Date: 01/30/2017):  R:   ABI: 1.06 (was 1.08 on 07-25-16),   PT: tri  DP: tri  TBI:  1.06 (was 0.91)  L:   ABI: 0.94 (was 0.76),   PT: mono (was bi)  DP: mono (was tri)  TBI: 0.83 (was 0.63) Stable and normal on the right with triphasic waveforms,   improved left ABI and TBI, to mild disease from moderate. However, left ankle waveforms declined to mono from bi and triphasic.    Left Carotid bruit: Carotid duplex in January 2017: Normal carotid arteries, bilaterally. Normal subclavian arteries, bilaterally. Patent vertebral arteries with antegrade flow.   PLAN:  The patient was counseled re smoking cessation and given several free resources re smoking cessation.  Continue graduated walking program.   Based on the patient's vascular studies and examination, pt will return to clinic in 6 monthswith ABI's and left LE arterial duplex.   I advised Mr. Muns to notify us if he develops concerns re the circulation in his feet/legs.    I discussed in depth with the patient the nature of atherosclerosis, and emphasized the importance of maximal medical management including strict control of blood pressure, blood glucose, and lipid levels, obtaining regular exercise, and cessation of smoking.  The patient is aware that without maximal medical management the underlying atherosclerotic disease process will progress, limiting the benefit of any interventions.  The patient was given information about PAD including  signs, symptoms, treatment, what symptoms should prompt the patient to seek immediate medical care, and risk reduction measures to take.  Charisse MarchSuzanne Nickel, RN, MSN, FNP-C [facility] [place] Office Phone: [phone]  Clinic MD: [facility]  01/30/17 11:13 AM

## 2017-02-12 NOTE — Addendum Note (Signed)
Addended by: Burton ApleyPETTY, Jennifier Smitherman A on: 02/12/2017 04:22 PM   Modules accepted: Orders

## 2017-08-07 ENCOUNTER — Ambulatory Visit: Payer: BLUE CROSS/BLUE SHIELD | Admitting: Family

## 2017-08-07 ENCOUNTER — Ambulatory Visit (HOSPITAL_COMMUNITY)
Admission: RE | Admit: 2017-08-07 | Discharge: 2017-08-07 | Disposition: A | Discharge status: Home / Self Care | Payer: BLUE CROSS/BLUE SHIELD | Source: Ambulatory Visit | Attending: Family | Admitting: Family

## 2017-08-07 ENCOUNTER — Encounter: Payer: Self-pay | Admitting: Family

## 2017-08-07 ENCOUNTER — Ambulatory Visit (INDEPENDENT_AMBULATORY_CARE_PROVIDER_SITE_OTHER)
Admission: RE | Admit: 2017-08-07 | Discharge: 2017-08-07 | Disposition: A | Discharge status: Home / Self Care | Payer: BLUE CROSS/BLUE SHIELD | Source: Ambulatory Visit | Attending: Family | Admitting: Family

## 2017-08-07 VITALS — BP 114/68 | HR 80 | Temp 98.2°F | Resp 18 | Ht 73.0 in | Wt 241.0 lb

## 2017-08-07 DIAGNOSIS — Z95828 Presence of other vascular implants and grafts: Secondary | ICD-10-CM

## 2017-08-07 DIAGNOSIS — I779 Disorder of arteries and arterioles, unspecified: Secondary | ICD-10-CM | POA: Diagnosis not present

## 2017-08-07 DIAGNOSIS — F17200 Nicotine dependence, unspecified, uncomplicated: Secondary | ICD-10-CM | POA: Diagnosis not present

## 2017-08-07 NOTE — Patient Instructions (Signed)
Steps to Quit Smoking Smoking tobacco can be bad for your health. It can also affect almost every organ in your body. Smoking puts you and people around you at risk for many serious long-lasting (chronic) diseases. Quitting smoking is hard, but it is one of the best things that you can do for your health. It is never too late to quit. What are the benefits of quitting smoking? When you quit smoking, you lower your risk for getting serious diseases and conditions. They can include:  Lung cancer or lung disease.  Heart disease.  Stroke.  Heart attack.  Not being able to have children (infertility).  Weak bones (osteoporosis) and broken bones (fractures).  If you have coughing, wheezing, and shortness of breath, those symptoms may get better when you quit. You may also get sick less often. If you are pregnant, quitting smoking can help to lower your chances of having a baby of low birth weight. What can I do to help me quit smoking? Talk with your doctor about what can help you quit smoking. Some things you can do (strategies) include:  Quitting smoking totally, instead of slowly cutting back how much you smoke over a period of time.  Going to in-person counseling. You are more likely to quit if you go to many counseling sessions.  Using resources and support systems, such as: ? Online chats with a counselor. ? Phone quitlines. ? Printed self-help materials. ? Support groups or group counseling. ? Text messaging programs. ? Mobile phone apps or applications.  Taking medicines. Some of these medicines may have nicotine in them. If you are pregnant or breastfeeding, do not take any medicines to quit smoking unless your doctor says it is okay. Talk with your doctor about counseling or other things that can help you.  Talk with your doctor about using more than one strategy at the same time, such as taking medicines while you are also going to in-person counseling. This can help make  quitting easier. What things can I do to make it easier to quit? Quitting smoking might feel very hard at first, but there is a lot that you can do to make it easier. Take these steps:  Talk to your family and friends. Ask them to support and encourage you.  Call phone quitlines, reach out to support groups, or work with a counselor.  Ask people who smoke to not smoke around you.  Avoid places that make you want (trigger) to smoke, such as: ? Bars. ? Parties. ? Smoke-break areas at work.  Spend time with people who do not smoke.  Lower the stress in your life. Stress can make you want to smoke. Try these things to help your stress: ? Getting regular exercise. ? Deep-breathing exercises. ? Yoga. ? Meditating. ? Doing a body scan. To do this, close your eyes, focus on one area of your body at a time from head to toe, and notice which parts of your body are tense. Try to relax the muscles in those areas.  Download or buy apps on your mobile phone or tablet that can help you stick to your quit plan. There are many free apps, such as QuitGuide from the CDC (Centers for Disease Control and Prevention). You can find more support from smokefree.gov and other websites.  This information is not intended to replace advice given to you by your health care provider. Make sure you discuss any questions you have with your health care provider. Document Released: 01/19/2009 Document   Revised: 11/21/2015 Document Reviewed: 08/09/2014 Elsevier Interactive Patient Education  2018 Elsevier Inc.     Peripheral Vascular Disease Peripheral vascular disease (PVD) is a disease of the blood vessels that are not part of your heart and brain. A simple term for PVD is poor circulation. In most cases, PVD narrows the blood vessels that carry blood from your heart to the rest of your body. This can result in a decreased supply of blood to your arms, legs, and internal organs, like your stomach or kidneys.  However, it most often affects a person's lower legs and feet. There are two types of PVD.  Organic PVD. This is the more common type. It is caused by damage to the structure of blood vessels.  Functional PVD. This is caused by conditions that make blood vessels contract and tighten (spasm).  Without treatment, PVD tends to get worse over time. PVD can also lead to acute ischemic limb. This is when an arm or limb suddenly has trouble getting enough blood. This is a medical emergency. Follow these instructions at home:  Take medicines only as told by your doctor.  Do not use any tobacco products, including cigarettes, chewing tobacco, or electronic cigarettes. If you need help quitting, ask your doctor.  Lose weight if you are overweight, and maintain a healthy weight as told by your doctor.  Eat a diet that is low in fat and cholesterol. If you need help, ask your doctor.  Exercise regularly. Ask your doctor for some good activities for you.  Take good care of your feet. ? Wear comfortable shoes that fit well. ? Check your feet often for any cuts or sores. Contact a doctor if:  You have cramps in your legs while walking.  You have leg pain when you are at rest.  You have coldness in a leg or foot.  Your skin changes.  You are unable to get or have an erection (erectile dysfunction).  You have cuts or sores on your feet that are not healing. Get help right away if:  Your arm or leg turns cold and blue.  Your arms or legs become red, warm, swollen, painful, or numb.  You have chest pain or trouble breathing.  You suddenly have weakness in your face, arm, or leg.  You become very confused or you cannot speak.  You suddenly have a very bad headache.  You suddenly cannot see. This information is not intended to replace advice given to you by your health care provider. Make sure you discuss any questions you have with your health care provider. Document Released:  06/19/2009 Document Revised: 08/31/2015 Document Reviewed: 09/02/2013 Elsevier Interactive Patient Education  2017 Elsevier Inc.  

## 2017-08-07 NOTE — Progress Notes (Signed)
[facility] [place]   CC: Follow up peripheral artery occlusive disease  History of Present Illness Cory Woodward is a 47 y.o. male returns today for follow-up s/pleft femoral-popliteal bypass on 02/10/2015 by Dr. Arbie Cookey. He presented with severe claudication and then had progression when he thrombosed his native superficial femoral artery. He had significant tibial disease and underwent tibial thrombectomy as well. He has had no history of myocardial infarction, had a negative cardiac stress test.  He reports tired feeling in both calves equally after walking, resolves after a few seconds, denies non healing wounds. 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 He has noticed the tip pf his left great toe tingling when he drives; this resolves when he moves his legs while driving. He states he tends to cross the left leg over the right when he drives.   He does walk a great deal on this job also, he brings cars to auctions, loads and unloads them.  He walks 2-3 miles daily in the course of his work day.   He denies any history of stroke or TIA.   He states that he snores, but does not wake himself or his wife up with this.   Diabetic: No Tobacco use: current smoker, decreased to 2 cigs/day, quits occassionally, started at age 41years   Pt meds include: Statin :Yes Betablocker: No ASA: Yes Other anticoagulants/antiplatelets: no     Past Medical History:  Diagnosis Date  . Chest pain    occasional  . Headache   . Hypertension   . Peripheral vascular disease [facility])     Social History Social History   Tobacco Use  . Smoking status: Current Some Day Smoker    Types: Cigarettes  . Smokeless tobacco: Never Used  . Tobacco comment: 2 cigs a day  Substance Use Topics  . Alcohol use: Yes    Alcohol/week: 3.0 oz    Types: 5 Shots of liquor per week    Comment: many once  1 month- several drinks  . Drug use: No    Family History History reviewed.  No pertinent family history.  Past Surgical History:  Procedure Laterality Date  . ARTERY EXPLORATION Left 02/10/2015   Procedure: LEFT BELOW KNEE POPLITEAL ARTERY EXPLORATION;  Surgeon: Larina Earthly, MD;  Location: [facility];  Service: Vascular;  Laterality: Left;  . EMBOLECTOMY Left 02/10/2015   Procedure: LEFT ANTERIOR TIBIAL, POSTERIOR TIBIAL AND PERONEAL ARTERY EMBOLECTOMY;  Surgeon: Larina Earthly, MD;  Location[facility] OR;  Service: Vascular;  Laterality: Left;  . FEMORAL-POPLITEAL BYPASS GRAFT Left 02/10/2015   Procedure: LEFT FEMORAL-ABOVE KNEE POPLITEAL ARTERY BYPASS GRAFT;  Surgeon: Larina Earthly, MD;  Location: [facility] OR;  Service: Vascular;  Laterality: Left;  . FINGER TENDON REPAIR Right    middle  . INTRAOPERATIVE ARTERIOGRAM Left 02/10/2015   Procedure: INTRA OPERATIVE ARTERIOGRAM;  Surgeon: Larina Earthly, MD;  Location: [facility] OR;  Service: Vascular;  Laterality: Left;  . PERIPHERAL VASCULAR CATHETERIZATION N/A 02/08/2015   Procedure: Abdominal Aortogram;  Surgeon: Larina Earthly, MD;  [facility] INVASIVE CV LAB;  Service: Cardiovascular;  Laterality: N/A;    Allergies  Allergen Reactions  . Peanut-Containing Drug Products Itching    Only has reaction to "RAW PEANUTS". No reaction with cooked peanuts, peanut butter, etc    Current Outpatient Medications  Medication Sig Dispense Refill  . aspirin 81 MG chewable tablet Chew 1 tablet (81 mg total) by mouth daily. 30 tablet 3  .  atorvastatin (LIPITOR) 40 MG tablet Take 1 tablet (40 mg total) by mouth daily. 30 tablet 6  . losartan-hydrochlorothiazide (HYZAAR) 100-25 MG tablet Take 1 tablet by mouth daily.     No current facility-administered medications for this visit.     ROS: See HPI for pertinent positives and negatives.   Physical Examination  Vitals:   08/07/17 1350  BP: 114/68  Pulse: 80  Resp: 18  Temp: 98.2 F (36.8 C)  TempSrc: Oral  SpO2: 98%  Weight: 241 lb (109.3 kg)  Height:  (1.854 m)   Body mass index is 31.8  kg/m.  General: A&O x 3, WDWN, obese male. Gait: normal HENT: Large neck Eyes: PERRLA. Pulmonary: Respirations are non labored, CTAB, without wheezes, rales,or rhonchi. Cardiac: regular rythm, nodetected murmur.    Carotid Bruits Right Left   Negative Positive   Abdominal aortic pulse is notpalpable. Radial pulses: 2+ right, 1+ left palpable  VASCULAR EXAM: Extremitieswithoutischemic changes,  withoutGangrene; withoutopen wounds.  LE Pulses Right Left  FEMORAL 2+palpable 2+palpable   POPLITEAL notpalpable  notpalpable  POSTERIOR TIBIAL 2+palpable  notpalpable   DORSALIS PEDIS ANTERIOR TIBIAL 2+palpable  Not palpable    Abdomen: soft, NT, no palpable masses. Skin: no rashes, no ulcers. Musculoskeletal: no muscle wasting or atrophy. Neurologic: A&O X 3; appropriate affect, Sensation is normal; MOTOR FUNCTION:  moving all extremities equally, motor strength 5/5 throughout. Speech is fluent/normal. CN 2-12 intact. Psychiatric: Thought content is normal, mood appropriate for clinical situation.     ASSESSMENT: Cory Woodward is a 47 y.o. male who is s/pleft femoral-popliteal bypass on 02/10/2015 for severe claudication which has resolved since the surgery.   He continues to smoke intermittently, started at age 59. Fortunately he does not have DM, but both his parent have DM and pt is obese. His atherosclerotic risk factors also include current smoker.  He takes a daily statin and ASA.  Fortunately he walks 2-3 miles daily at his job, develops bilateral calf tired feeling after 1/2 mile. No signs of ischemia in his feet or legs.     DATA  Left LE arterial duplex(08/07/17): Left leg bypass graft with no evidence of stenosis, all triphasic waveforms. No significant change compared to the exams on 07-25-16 and 01-30-17.   ABI (Date: 08/07/2017):  R:   ABI: 1.07 (was  1.06 on 01-30-17),   PT: tri  DP: tri  TBI:  0.94 (was 1.06)  L:   ABI: 0.90 (was 0.94),   PT: mono  DP: mono  TBI: 0.56 (was 0.83)  Stable and normal right ABI and TBI with triphasic waveforms. Slight decline in left ABI, mild disease, waveforms remain monophasic. Significatn decline in left TBI.    Left Carotid bruit: Carotid duplex in January 2017: Normal carotid arteries, bilaterally. Normal subclavian arteries, bilaterally. Patent vertebral arteries with antegrade flow.     PLAN:  The patient was counseled re smoking cessation and given several free resources re smoking cessation.  Continue extensive walking.  Based on the patient's vascular studies and examination, pt will return to clinic in 6 months with ABI's and left LE arterial duplex.  I advised Mr. Mclees to notify us if he develops concerns re the circulation in his feet/legs.   I discussed in depth with the patient the nature of atherosclerosis, and emphasized the importance of maximal medical management including strict control of blood pressure, blood glucose, and lipid levels, obtaining regular exercise, and cessation of smoking.  The patient is aware that  without maximal medical management the underlying atherosclerotic disease process will progress, limiting the benefit of any interventions.  The patient was given information about PAD including signs, symptoms, treatment, what symptoms should prompt the patient to seek immediate medical care, and risk reduction measures to take.  Charisse March, RN, MSN, FNP-C [facility] Phone: [phone]  Clinic MD: [facility]  08/07/17 2:13 PM

## 2017-09-18 ENCOUNTER — Other Ambulatory Visit: Payer: Self-pay

## 2017-09-18 DIAGNOSIS — I779 Disorder of arteries and arterioles, unspecified: Secondary | ICD-10-CM

## 2018-02-09 ENCOUNTER — Ambulatory Visit: Payer: BLUE CROSS/BLUE SHIELD | Admitting: Family

## 2018-02-09 ENCOUNTER — Other Ambulatory Visit (HOSPITAL_COMMUNITY): Payer: BLUE CROSS/BLUE SHIELD

## 2018-02-09 ENCOUNTER — Encounter (HOSPITAL_COMMUNITY): Payer: BLUE CROSS/BLUE SHIELD

## 2018-03-03 ENCOUNTER — Ambulatory Visit (INDEPENDENT_AMBULATORY_CARE_PROVIDER_SITE_OTHER): Payer: BLUE CROSS/BLUE SHIELD | Admitting: Family

## 2018-03-03 ENCOUNTER — Encounter: Payer: Self-pay | Admitting: Family

## 2018-03-03 ENCOUNTER — Ambulatory Visit (HOSPITAL_COMMUNITY)
Admission: RE | Admit: 2018-03-03 | Discharge: 2018-03-03 | Disposition: A | Discharge status: Home / Self Care | Payer: BLUE CROSS/BLUE SHIELD | Source: Ambulatory Visit | Attending: Family | Admitting: Family

## 2018-03-03 ENCOUNTER — Ambulatory Visit (INDEPENDENT_AMBULATORY_CARE_PROVIDER_SITE_OTHER)
Admission: RE | Admit: 2018-03-03 | Discharge: 2018-03-03 | Disposition: A | Discharge status: Home / Self Care | Payer: BLUE CROSS/BLUE SHIELD | Source: Ambulatory Visit | Attending: Family | Admitting: Family

## 2018-03-03 VITALS — BP 125/88 | HR 72 | Temp 97.1°F | Resp 14 | Ht 73.0 in | Wt 240.0 lb

## 2018-03-03 DIAGNOSIS — I779 Disorder of arteries and arterioles, unspecified: Secondary | ICD-10-CM

## 2018-03-03 DIAGNOSIS — Z95828 Presence of other vascular implants and grafts: Secondary | ICD-10-CM | POA: Diagnosis not present

## 2018-03-03 DIAGNOSIS — F17200 Nicotine dependence, unspecified, uncomplicated: Secondary | ICD-10-CM

## 2018-03-03 NOTE — Patient Instructions (Signed)
Steps to Quit Smoking Smoking tobacco can be bad for your health. It can also affect almost every organ in your body. Smoking puts you and people around you at risk for many serious long-lasting (chronic) diseases. Quitting smoking is hard, but it is one of the best things that you can do for your health. It is never too late to quit. What are the benefits of quitting smoking? When you quit smoking, you lower your risk for getting serious diseases and conditions. They can include:  Lung cancer or lung disease.  Heart disease.  Stroke.  Heart attack.  Not being able to have children (infertility).  Weak bones (osteoporosis) and broken bones (fractures).  If you have coughing, wheezing, and shortness of breath, those symptoms may get better when you quit. You may also get sick less often. If you are pregnant, quitting smoking can help to lower your chances of having a baby of low birth weight. What can I do to help me quit smoking? Talk with your doctor about what can help you quit smoking. Some things you can do (strategies) include:  Quitting smoking totally, instead of slowly cutting back how much you smoke over a period of time.  Going to in-person counseling. You are more likely to quit if you go to many counseling sessions.  Using resources and support systems, such as: ? Online chats with a counselor. ? Phone quitlines. ? Printed self-help materials. ? Support groups or group counseling. ? Text messaging programs. ? Mobile phone apps or applications.  Taking medicines. Some of these medicines may have nicotine in them. If you are pregnant or breastfeeding, do not take any medicines to quit smoking unless your doctor says it is okay. Talk with your doctor about counseling or other things that can help you.  Talk with your doctor about using more than one strategy at the same time, such as taking medicines while you are also going to in-person counseling. This can help make  quitting easier. What things can I do to make it easier to quit? Quitting smoking might feel very hard at first, but there is a lot that you can do to make it easier. Take these steps:  Talk to your family and friends. Ask them to support and encourage you.  Call phone quitlines, reach out to support groups, or work with a counselor.  Ask people who smoke to not smoke around you.  Avoid places that make you want (trigger) to smoke, such as: ? Bars. ? Parties. ? Smoke-break areas at work.  Spend time with people who do not smoke.  Lower the stress in your life. Stress can make you want to smoke. Try these things to help your stress: ? Getting regular exercise. ? Deep-breathing exercises. ? Yoga. ? Meditating. ? Doing a body scan. To do this, close your eyes, focus on one area of your body at a time from head to toe, and notice which parts of your body are tense. Try to relax the muscles in those areas.  Download or buy apps on your mobile phone or tablet that can help you stick to your quit plan. There are many free apps, such as QuitGuide from the CDC (Centers for Disease Control and Prevention). You can find more support from smokefree.gov and other websites.  This information is not intended to replace advice given to you by your health care provider. Make sure you discuss any questions you have with your health care provider. Document Released: 01/19/2009 Document   Revised: 11/21/2015 Document Reviewed: 08/09/2014 Elsevier Interactive Patient Education  2018 Elsevier Inc.     Peripheral Vascular Disease Peripheral vascular disease (PVD) is a disease of the blood vessels that are not part of your heart and brain. A simple term for PVD is poor circulation. In most cases, PVD narrows the blood vessels that carry blood from your heart to the rest of your body. This can result in a decreased supply of blood to your arms, legs, and internal organs, like your stomach or kidneys.  However, it most often affects a person's lower legs and feet. There are two types of PVD.  Organic PVD. This is the more common type. It is caused by damage to the structure of blood vessels.  Functional PVD. This is caused by conditions that make blood vessels contract and tighten (spasm).  Without treatment, PVD tends to get worse over time. PVD can also lead to acute ischemic limb. This is when an arm or limb suddenly has trouble getting enough blood. This is a medical emergency. Follow these instructions at home:  Take medicines only as told by your doctor.  Do not use any tobacco products, including cigarettes, chewing tobacco, or electronic cigarettes. If you need help quitting, ask your doctor.  Lose weight if you are overweight, and maintain a healthy weight as told by your doctor.  Eat a diet that is low in fat and cholesterol. If you need help, ask your doctor.  Exercise regularly. Ask your doctor for some good activities for you.  Take good care of your feet. ? Wear comfortable shoes that fit well. ? Check your feet often for any cuts or sores. Contact a doctor if:  You have cramps in your legs while walking.  You have leg pain when you are at rest.  You have coldness in a leg or foot.  Your skin changes.  You are unable to get or have an erection (erectile dysfunction).  You have cuts or sores on your feet that are not healing. Get help right away if:  Your arm or leg turns cold and blue.  Your arms or legs become red, warm, swollen, painful, or numb.  You have chest pain or trouble breathing.  You suddenly have weakness in your face, arm, or leg.  You become very confused or you cannot speak.  You suddenly have a very bad headache.  You suddenly cannot see. This information is not intended to replace advice given to you by your health care provider. Make sure you discuss any questions you have with your health care provider. Document Released:  06/19/2009 Document Revised: 08/31/2015 Document Reviewed: 09/02/2013 Elsevier Interactive Patient Education  2017 Elsevier Inc.  

## 2018-03-03 NOTE — Progress Notes (Signed)
[facility] [place]   CC: Follow up peripheral artery occlusive disease  History of Present Illness Cory Woodward is a 47 y.o. male returns today for follow-up s/pleft femoral-popliteal bypass on 11/04/2016by [name]. He presented with severe claudication and then had progression when he thrombosed his native superficial femoral artery. He had significant tibial disease and underwent tibial thrombectomy as well. He has had no history of myocardial infarction, had a negative cardiac stress test.  Hereports tired feeling in both calves equally after walking about 200-300 yards, resolves after a few seconds, denies non healing wounds. 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 He has noticed the tip pf his left great toe tingling when he drives; this resolves when he moves his legs while driving. He states he tends to cross the left leg over the right when he drives.   He does walk a great deal on this job also, he brings cars to auctions, loads and unloads them. He walks 2-3 miles daily in the course of his work day.  He denies any history of stroke or TIA.   He states that he snores, but does not wake himself or his wife up with this.   Diabetic: No, his parents have DM Tobacco use: currentsmoker, decreased to 2-3 cigs/day, quits occassionally, started at age [age]years   Pt meds include: Statin :Yes Betablocker: No ASA: Yes Other anticoagulants/antiplatelets: no   Past Medical History:  Diagnosis Date  . Chest pain    occasional  . Headache   . Hypertension   . Peripheral vascular disease [facility])     Social History Social History   Tobacco Use  . Smoking status: Current Some Day Smoker    Types: Cigarettes  . Smokeless tobacco: Never Used  . Tobacco comment: 2 cigs a day  Substance Use Topics  . Alcohol use: Yes    Alcohol/week: 5.0 standard drinks    Types: 5 Shots of liquor per week    Comment: many once  1 month- several drinks  .  Drug use: No    Family History History reviewed. No pertinent family history.  Past Surgical History:  Procedure Laterality Date  . ARTERY EXPLORATION Left 02/10/2015   Procedure: LEFT BELOW KNEE POPLITEAL ARTERY EXPLORATION;  Surgeon: Larina Earthlyodd F Early, MD;  [facility];  Service: Vascular;  Laterality: Left;  . EMBOLECTOMY Left 02/10/2015   Procedure: LEFT ANTERIOR TIBIAL, POSTERIOR TIBIAL AND PERONEAL ARTERY EMBOLECTOMY;  Surgeon: Larina Earthlyodd F Early, MD;  [facility];  Service: Vascular;  Laterality: Left;  . FEMORAL-POPLITEAL BYPASS GRAFT Left 02/10/2015   Procedure: LEFT FEMORAL-ABOVE KNEE POPLITEAL ARTERY BYPASS GRAFT;  Surgeon: Larina Earthlyodd F Early, MD;  [facility];  Service: Vascular;  Laterality: Left;  . FINGER TENDON REPAIR Right    middle  . INTRAOPERATIVE ARTERIOGRAM Left 02/10/2015   Procedure: INTRA OPERATIVE ARTERIOGRAM;  Surgeon: Larina Earthlyodd F Early, MD;  [facility];  Service: Vascular;  Laterality: Left;  . PERIPHERAL VASCULAR CATHETERIZATION N/A 02/08/2015   Procedure: Abdominal Aortogram;  Surgeon: Larina Earthlyodd F Early, MD;  [facility];  Service: Cardiovascular;  Laterality: N/A;    Allergies  Allergen Reactions  . Peanut-Containing Drug Products Itching    Only has reaction to "RAW PEANUTS". No reaction with cooked peanuts, peanut butter, etc    Current Outpatient Medications  Medication Sig Dispense Refill  . aspirin 81 MG chewable tablet Chew 1 tablet (81 mg total) by mouth daily. 30 tablet  3  . atorvastatin (LIPITOR) 40 MG tablet Take 1 tablet (40 mg total) by mouth daily. 30 tablet 6  . losartan-hydrochlorothiazide (HYZAAR) 100-25 MG tablet Take 1 tablet by mouth daily.     No current facility-administered medications for this visit.     ROS: See HPI for pertinent positives and negatives.   Physical Examination  Vitals:   03/03/18 0858  BP: 125/88  Pulse: 72  Resp: 14  Temp: (!) 97.1 F (36.2 C)  SpO2: 98%  Weight: 240 lb (108.9 kg)  Height: 6'  1" (1.854 m)   Body mass index is 31.66 kg/m.  General: A&O x 3, WDWN, obese male. Gait: normal HENT: Large neck Eyes: PERRLA. Pulmonary: Respirations are non labored, CTAB, without wheezes, rales,or rhonchi. Cardiac: regular rythm, nodetected murmur.    Carotid Bruits Right Left   Negative Positive   Abdominal aortic pulseis notpalpable. Radial pulses: 2+ right, 1+ left palpable  VASCULAR EXAM: Extremitieswithoutischemic changes, withoutGangrene; withoutopen wounds.  LE Pulses Right Left  FEMORAL 1+palpable 2+palpable   POPLITEAL notpalpable  notpalpable  POSTERIOR TIBIAL 1+palpable  notpalpable   DORSALIS PEDIS ANTERIOR TIBIAL 2+palpable  Not palpable    Abdomen: soft, NT, no palpable masses. Skin: no rashes, no cellulitis, no ulcers noted. Musculoskeletal: no muscle wasting or atrophy.  Neurologic: A&O X 3; appropriate affect, Sensation is normal; MOTOR FUNCTION:  moving all extremities equally, motor strength 5/5 throughout. Speech is fluent/normal. CN 2-12 intact. Psychiatric: Thought content is normal, mood appropriate for clinical situation.     ASSESSMENT: Cory Woodward is a 47 y.o. male who is s/pleft femoral-popliteal bypass on 02/10/2015 for severe claudication which has resolved since the surgery.   Hecontinues to smoke intermittently,started at age 54. Fortunately he does not have DM, but both his parent have DM and pt is obese. No A1C result on file.  His atherosclerotic risk factors also include current smoker.  He takes a daily statin and ASA. Fortunately he walks 2-3 miles daily at his job, develops bilateral calf tired feeling after 1/2 mile. No signs of ischemia in his feet or legs.   DATA  Left LE arterial duplex (03-03-18): Left leg bypass graft with no evidence of stenosis, all triphasic waveforms. No significant change compared to  the exams on4-19-18,01-30-17, and 08-07-17.    ABI (Date: 03/03/2018):  R:   ABI: 1.05 (was 1.07 on 08-07-17),   PT: tri  DP: tri  TBI:  0.86, toe pressure 126, (was 0.94)  L:   ABI: 0.91 (was 0.90),   PT: bi  DP: bi  TBI: 0.79, toe pressure 116, (was 0.54) Bilateral ABI remain normal; triphasic waveforms in the right, biphasic in the left.  Right TBI remains normal, left TBI improved to normal.     Left Carotid bruit: Carotid duplex in January 2017: Normal carotid arteries, bilaterally. Normal subclavian arteries, bilaterally. Patent vertebral arteries with antegrade flow.      PLAN:  The patient was counseled re smoking cessation and given several free resources re smoking cessation.  Continue extensive walking.  Based on the patient's vascular studies and examination, pt will return to clinic in 1 year with ABI's and left LE arterial duplex.  I advised Mr. Goodrich to notify us if he develops concerns re the circulation in his feet/legs.    I discussed in depth with the patient the nature of atherosclerosis, and emphasized the importance of maximal medical management including strict control of blood pressure, blood glucose, and lipid levels, obtaining regular  exercise, and cessation of smoking.  The patient is aware that without maximal medical management the underlying atherosclerotic disease process will progress, limiting the benefit of any interventions.  The patient was given information about PAD including signs, symptoms, treatment, what symptoms should prompt the patient to seek immediate medical care, and risk reduction measures to take.  Charisse March, RN, MSN, FNP-C [facility] Phone: [phone]  Clinic MD: Edilia Bo on call  03/03/18 9:24 AM

## 2018-03-08 ENCOUNTER — Encounter: Payer: Self-pay | Admitting: Family

## 2019-05-21 ENCOUNTER — Other Ambulatory Visit: Payer: Self-pay

## 2019-05-21 ENCOUNTER — Telehealth (HOSPITAL_COMMUNITY): Payer: Self-pay

## 2019-05-21 DIAGNOSIS — I779 Disorder of arteries and arterioles, unspecified: Secondary | ICD-10-CM

## 2019-05-21 NOTE — Telephone Encounter (Signed)

## 2019-05-24 ENCOUNTER — Ambulatory Visit: Payer: BLUE CROSS/BLUE SHIELD

## 2019-05-24 ENCOUNTER — Other Ambulatory Visit (HOSPITAL_COMMUNITY): Payer: BLUE CROSS/BLUE SHIELD

## 2019-05-24 ENCOUNTER — Encounter (HOSPITAL_COMMUNITY): Payer: BLUE CROSS/BLUE SHIELD

## 2019-07-01 ENCOUNTER — Telehealth (HOSPITAL_COMMUNITY): Payer: Self-pay

## 2019-07-01 NOTE — Telephone Encounter (Signed)

## 2019-07-05 ENCOUNTER — Other Ambulatory Visit: Payer: Self-pay

## 2019-07-05 ENCOUNTER — Ambulatory Visit (HOSPITAL_COMMUNITY)
Admission: RE | Admit: 2019-07-05 | Discharge: 2019-07-05 | Disposition: A | Discharge status: Home / Self Care | Payer: BC Managed Care – PPO | Source: Ambulatory Visit | Attending: Vascular Surgery | Admitting: Vascular Surgery

## 2019-07-05 ENCOUNTER — Ambulatory Visit (INDEPENDENT_AMBULATORY_CARE_PROVIDER_SITE_OTHER)
Admission: RE | Admit: 2019-07-05 | Discharge: 2019-07-05 | Disposition: A | Discharge status: Home / Self Care | Payer: BC Managed Care – PPO | Source: Ambulatory Visit | Attending: Vascular Surgery | Admitting: Vascular Surgery

## 2019-07-05 ENCOUNTER — Ambulatory Visit (INDEPENDENT_AMBULATORY_CARE_PROVIDER_SITE_OTHER): Payer: BC Managed Care – PPO | Admitting: Physician Assistant

## 2019-07-05 VITALS — BP 116/81 | HR 70 | Temp 97.7°F | Resp 20 | Ht 73.0 in | Wt 222.2 lb

## 2019-07-05 DIAGNOSIS — I779 Disorder of arteries and arterioles, unspecified: Secondary | ICD-10-CM

## 2019-07-05 DIAGNOSIS — F17200 Nicotine dependence, unspecified, uncomplicated: Secondary | ICD-10-CM | POA: Diagnosis not present

## 2019-07-05 DIAGNOSIS — I739 Peripheral vascular disease, unspecified: Secondary | ICD-10-CM

## 2019-07-05 NOTE — Progress Notes (Signed)
Established Previous Bypass   History of Present Illness   Cory Woodward is a 49 y.o. (02/06/1971) male who presents to go over vascular studies related to PAD.  He had a left femoral to popliteal bypass with vein and tibial thrombectomy by Dr. Arbie Cookey 02/10/2015.  He denies any claudication, rest pain, or nonhealing wounds of bilateral lower extremities.  He is taking an aspirin and statin daily.  He is a current everyday smoker.  He works as a Naval architect for [employer].  The patient's PMH, PSH, SH, and FamHx were reviewed and are unchanged from prior visit.  Current Outpatient Medications  Medication Sig Dispense Refill  . aspirin 81 MG chewable tablet Chew 1 tablet (81 mg total) by mouth daily. 30 tablet 3  . atorvastatin (LIPITOR) 40 MG tablet Take 1 tablet (40 mg total) by mouth daily. 30 tablet 6  . losartan-hydrochlorothiazide (HYZAAR) 100-25 MG tablet Take 1 tablet by mouth daily.     No current facility-administered medications for this visit.    REVIEW OF SYSTEMS (negative unless checked):   Cardiac:  []  Chest pain or chest pressure? []  Shortness of breath upon activity? []  Shortness of breath when lying flat? []  Irregular heart rhythm?  Vascular:  []  Pain in calf, thigh, or hip brought on by walking? []  Pain in feet at night that wakes you up from your sleep? []  Blood clot in your veins? []  Leg swelling?  Pulmonary:  []  Oxygen at home? []  Productive cough? []  Wheezing?  Neurologic:  []  Sudden weakness in arms or legs? []  Sudden numbness in arms or legs? []  Sudden onset of difficult speaking or slurred speech? []  Temporary loss of vision in one eye? []  Problems with dizziness?  Gastrointestinal:  []  Blood in stool? []  Vomited blood?  Genitourinary:  []  Burning when urinating? []  Blood in urine?  Psychiatric:  []  Major depression  Hematologic:  []  Bleeding problems? []  Problems with blood clotting?  Dermatologic:  []  Rashes or  ulcers?  Constitutional:  []  Fever or chills?  Ear/Nose/Throat:  []  Change in hearing? []  Nose bleeds? []  Sore throat?  Musculoskeletal:  []  Back pain? []  Joint pain? []  Muscle pain?   Physical Examination   Vitals:   07/05/19 1057  BP: 116/81  Pulse: 70  Resp: 20  Temp: 97.7 F (36.5 C)  SpO2: 98%  Weight: 222 lb 3.2 oz (100.8 kg)  Height: 6\' 1"  (1.854 m)   Body mass index is 29.32 kg/m.  General:  WDWN in NAD; vital signs documented above Gait: Not observed HENT: WNL, normocephalic Pulmonary: normal non-labored breathing , without Rales, rhonchi,  wheezing Cardiac: regular HR Abdomen: soft, NT, no masses Skin: without rashes Vascular Exam/Pulses:  Right Left  Radial 2+ (normal) 2+ (normal)  DP 2+ (normal) 1+ (weak)  PT 2+ (normal) 2+ (normal)   Extremities: without ischemic changes, without Gangrene , without cellulitis; without open wounds;  Musculoskeletal: no muscle wasting or atrophy  Neurologic: A&O X 3;  No focal weakness or paresthesias are detected Psychiatric:  The pt has Normal affect.  Non-Invasive Vascular Imaging ABI  ABI/TBIToday's ABIToday's TBIPrevious ABIPrevious TBI  +-------+-----------+-----------+------------+------------+  Right 1.19    1.03    1.05    0.86      +-------+-----------+-----------+------------+------------+  Left  1.16    0.78    0.91    0.79      Bypass Duplex   Widely patent left femoral to popliteal bypass graft with no evidence  of any hemodynamically significant stenosis   Medical Decision Making   Cory Woodward is a 49 y.o. male who presents for graft surveillance with history of left femoral to popliteal bypass 02/2015   Patent left femoral to popliteal bypass and well-perfused left foot with palpable pedal pulses  Duplex demonstrates a widely patent bypass graft without any areas of stenosis and ABIs are unchanged over the past year  Continue aspirin and  statin daily  Encouraged smoking cessation  Recheck bypass duplex and ABIs in 1 year   Dagoberto Ligas PA-C [facility] Office: [phone]  Clinic MD: Oneida Alar

## 2019-07-06 ENCOUNTER — Other Ambulatory Visit: Payer: Self-pay | Admitting: *Deleted

## 2019-07-06 DIAGNOSIS — Z95828 Presence of other vascular implants and grafts: Secondary | ICD-10-CM

## 2019-07-06 DIAGNOSIS — I739 Peripheral vascular disease, unspecified: Secondary | ICD-10-CM

## 2019-08-09 ENCOUNTER — Other Ambulatory Visit: Payer: Self-pay

## 2019-08-09 ENCOUNTER — Other Ambulatory Visit: Payer: Self-pay | Admitting: Obstetrics and Gynecology

## 2019-08-09 ENCOUNTER — Ambulatory Visit
Admission: RE | Admit: 2019-08-09 | Discharge: 2019-08-09 | Disposition: A | Discharge status: Home / Self Care | Payer: Self-pay | Source: Ambulatory Visit | Attending: Obstetrics and Gynecology | Admitting: Obstetrics and Gynecology

## 2019-08-09 DIAGNOSIS — W19XXXA Unspecified fall, initial encounter: Secondary | ICD-10-CM

## 2019-08-09 DIAGNOSIS — S92002A Unspecified fracture of left calcaneus, initial encounter for closed fracture: Secondary | ICD-10-CM | POA: Diagnosis not present

## 2019-09-07 DIAGNOSIS — H169 Unspecified keratitis: Secondary | ICD-10-CM | POA: Diagnosis not present

## 2019-09-09 DIAGNOSIS — H169 Unspecified keratitis: Secondary | ICD-10-CM | POA: Diagnosis not present

## 2020-03-22 DIAGNOSIS — M25522 Pain in left elbow: Secondary | ICD-10-CM | POA: Diagnosis not present

## 2020-04-19 DIAGNOSIS — J209 Acute bronchitis, unspecified: Secondary | ICD-10-CM | POA: Diagnosis not present

## 2020-04-19 DIAGNOSIS — Z20828 Contact with and (suspected) exposure to other viral communicable diseases: Secondary | ICD-10-CM | POA: Diagnosis not present

## 2020-04-19 DIAGNOSIS — R509 Fever, unspecified: Secondary | ICD-10-CM | POA: Diagnosis not present

## 2020-04-19 DIAGNOSIS — J0100 Acute maxillary sinusitis, unspecified: Secondary | ICD-10-CM | POA: Diagnosis not present

## 2020-05-29 DIAGNOSIS — Z20822 Contact with and (suspected) exposure to covid-19: Secondary | ICD-10-CM | POA: Diagnosis not present

## 2020-08-19 IMAGING — CR DG FOOT COMPLETE 3+V*L*
3 series · 3 of 3 positions shown · non-contrast
Comparison: None.

CLINICAL DATA: Pain status post fall

EXAM:
LEFT FOOT - COMPLETE 3+ VIEW

[x foot ap left]
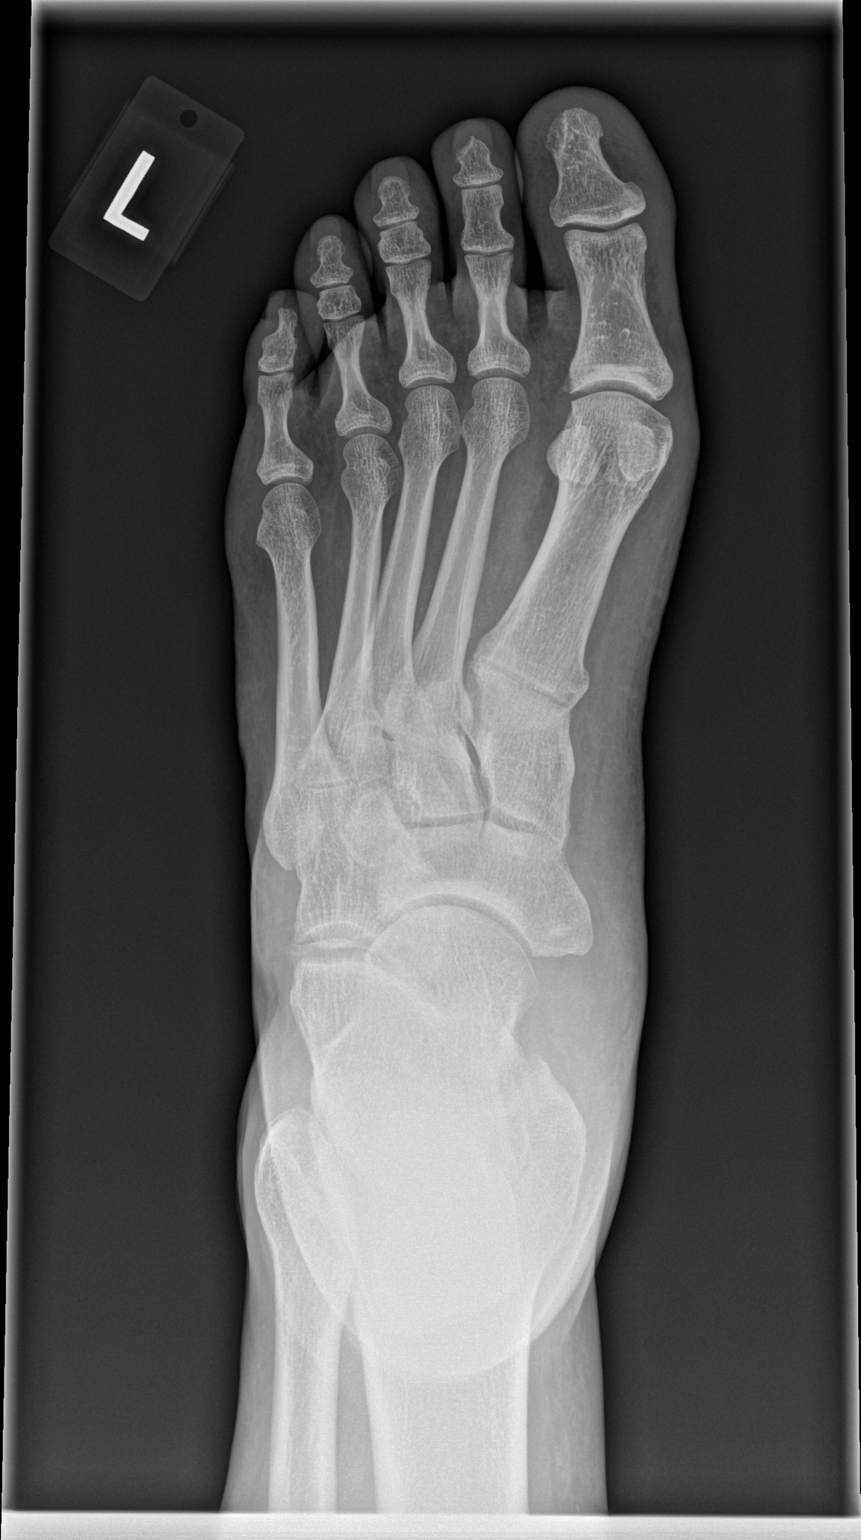

[x foot obl left]
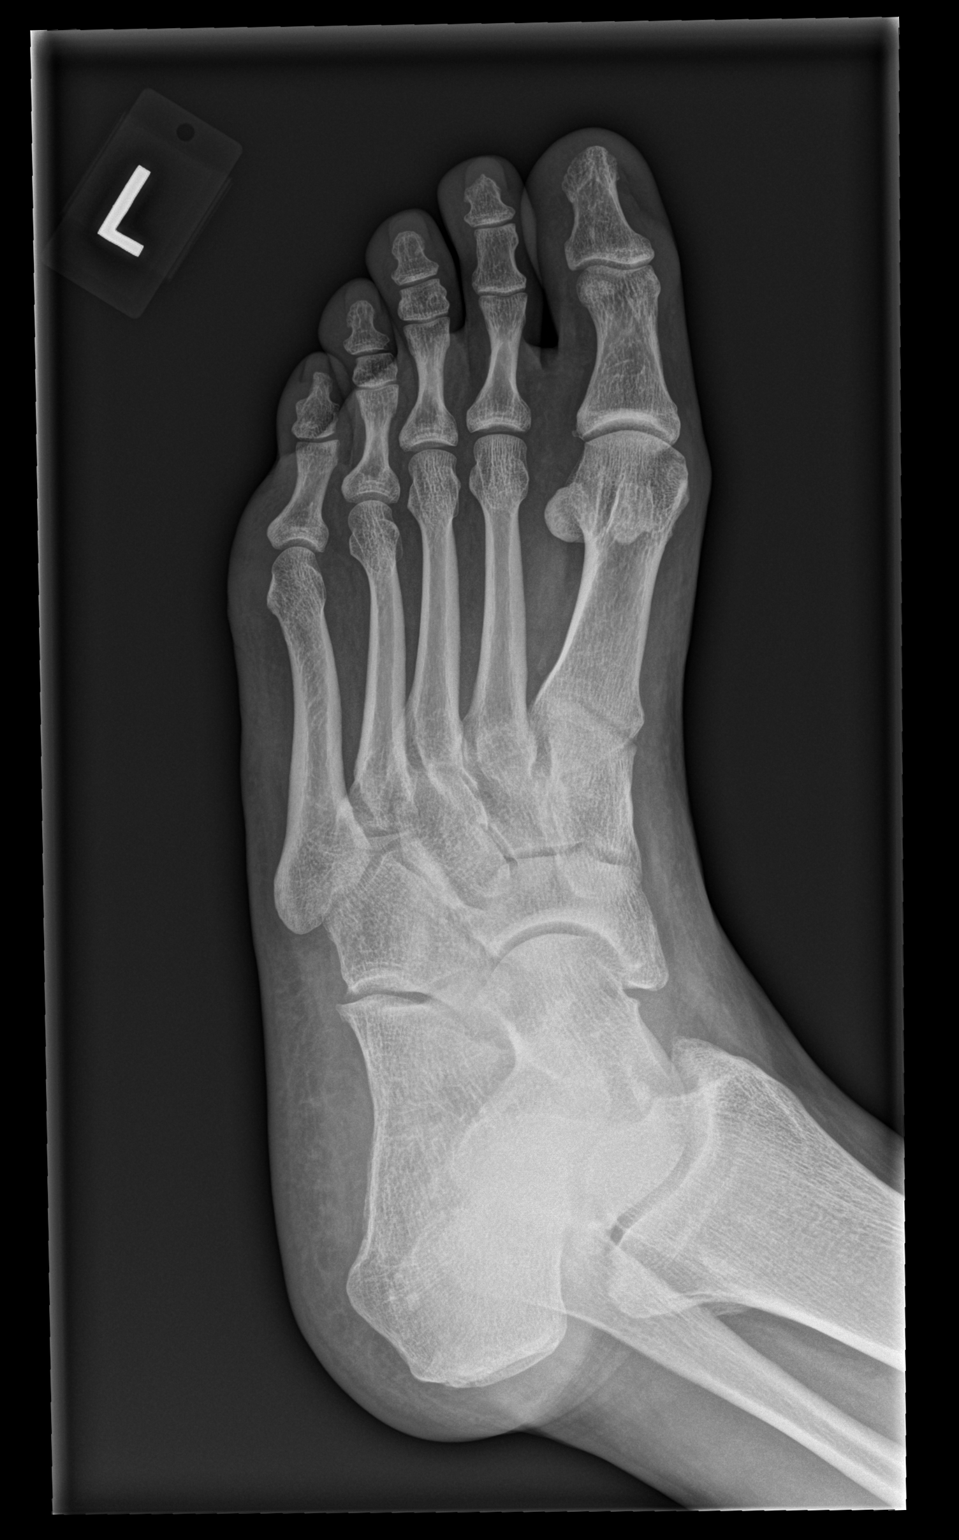

[x foot lat left]
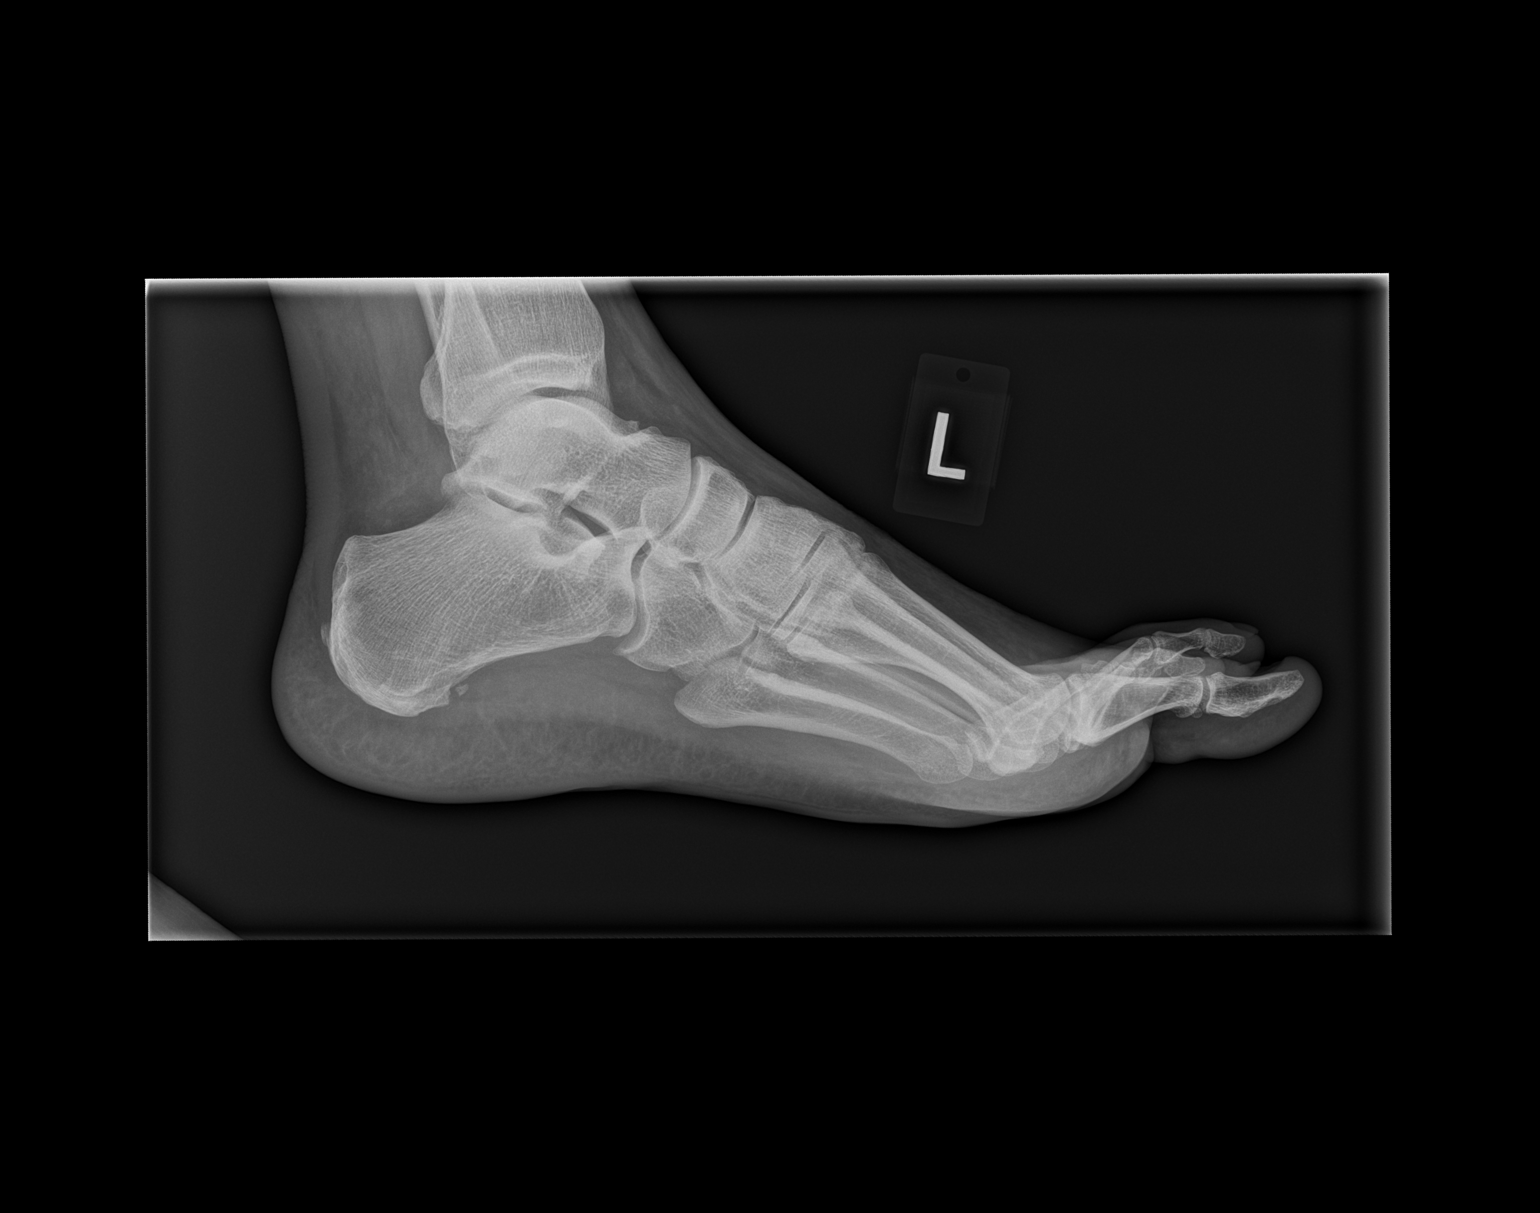

[3 of 3 positions shown; findings below may reference images not displayed]

FINDINGS: There is a small osseous fragment adjacent to the plantar aspect of
the posterior calcaneus. This appears to represent an acutely
fractured plantar calcaneal spur. There is no additional acute
displaced fracture or dislocation. There are no significant
degenerative changes.
IMPRESSION: Acutely fractured plantar calcaneal spur.

## 2023-01-02 NOTE — Progress Notes (Signed)
 [facility] ENT & AUDIOLOGY JENEL HAMMERSMITH   Audiology Progress Note  Date of Service 01/02/2023  Patient Name Cory Woodward  Patient DOB 09-Aug-1970  Patient Age 52 y.o.   ADD-ON AUDIOLOGIC EVALUATION   Caron LITTIE Blizzard presents for an audiologic evaluation. Patient is being seen in conjunction with Dr. Elyse and a hearing evaluation was ordered due to the symptoms listed below. The hearing evaluation will assess the current hearing status.   HISTORY:  [name] is a 52 y.o. year-old male with a history of bilateral hearing loss and bilateral tinnitus.   RESULTS:  Otoscopic exam revealed clear ear canals to the tympanic membrane in both ears.  Audiometric Testing: Today's results were obtained using standard audiology and insert earphones with good reliability.  LEFT ear:Thresholds consistent with a moderate to mild  sensorineural hearing loss, 250Hz  to 1K, then sloping to a moderately severe HFSNHL. Speech testing gave an SRT of 60 dBHL and WRS of 84% presented at 80dBHL, using a NU-6 wordlist.   RIGHT ear: Thresholds consistent with a moderate high frequency sensorineural hearing loss.  Speech testing gave an SRT of 25 dBHL and WRS of 100% presented at 55dBHL, using a NU-6 wordlist.   *SEE IMAGE IN MEDIA TAB* Results and recommendations were discussed with the patient  RECOMMENDATIONS: . Follow up with Dr. Elyse today. May need MRI and/ or Acoustic reflex to ruleout a retrocochlear disorder.  SABRA Re-evaluate per MD or sooner with any changes or concerns regarding hearing status.  . Consider trial with amplification pending medical clearance and patient motivation. Amplification will improve the hearing and communication with others. The patient was given information for our [facility] with the hope she can get in faster for her Hearing Aid Consult.   Sharlet Bellini, Au.D. Doctor of Audiology

## 2023-01-02 NOTE — Progress Notes (Signed)
 Otolaryngology New Patient Note  Subjective: Mr. Cory Woodward is a 52 y.o. male who presents today for evaluation of bilateral hearing loss. Loss has been long-standing and gradually progressive. Subjectively the left ear is worse than the right.  The patient describes tinnitus, which is bilateral and non-pulsatile. Denies otalgia,  otorrhea, recent infections, neck masses, odynophagia, voice changes, vertigo. Associated symptoms include: difficulty having a conversation in a crowd, talking on the phone, and hearing the TV.  The patient denies a history of recurrent infection, chronic otitis media, or prior ear surgeries. 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 No recreational noise exposure or h/o acoustic trauma.  No past medical history on file. Past Surgical History:  Procedure Laterality Date  . SHOULDER ARTHROSCOPY W/ ROTATOR CUFF REPAIR Right 07/03/2021   Procedure: SHOULDER ARTHROSCOPY W/ ROTATOR CUFF REPAIR, EXTENSIVE DEBRIDEMENT;  Surgeon: [place] Miller Case, MD;  Location: [facility];  Service: Orthopedics;  Laterality: Right;   No family history on file.   Allergies  Allergen Reactions  . Peanut Itching    Only has reaction to RAW PEANUTS. No reaction with cooked peanuts, peanut butter, etc     ROS A complete review of systems was conducted and was negative except as stated in the HPI.   Objective: Vitals:   01/02/23 0841  BP: (!) 151/105  Pulse: 67  Temp: 97.5 F (36.4 C)   Physical Exam:   General Normocephalic. Awake, alert and appropriate for the exam.  Eyes PERRL, no scleral icterus or conjunctival hemorrhage.  EOMI.  Ears Right auricle and EAC normal. Right TM: intact, no effusion, no retraction, normal landmarks.  Left auricle and EAC normal. Left TM: intact, no effusion, no retraction, normal landmarks  Weber midline.  Nose No external nasal deformities. Septum straight. Nasal mucosa moist. No significant  turbinate hypertrophy. No masses or polyps.  Oral Pharynx Mucosa moist and clear. No masses, lesions, or ulceration noted on the lips, alveolar ridges, buccal mucosa, floor of mouth, tongue, soft/hard palate, tonsils, or posterior pharynx. Soft FOM. Mobile tongue.  Dentition is grossly normal.   Neck/Lymphatics Soft and supple without LAD. Thyroid normal to palpation without dominant nodule or irregularity. Palpation of the salivary glands demonstrates no masses or irregularity.   Cardio-vascular No cyanosis, regular rate  Pulmonary No audible stridor, Breathing easily with no labor. No dysphonia.   Neuro Symmetric facial movement.   Tongue protrudes in midline.  Psychiatry Appropriate affect and mood for clinic visit.  Skin No scars or lesions on face or neck.    Audiogram today - personally reviewed and demonstrates normal downsloping to moderately-severe SNHL at 2000 Hz on the left. Moderate SNHL upsloping to mild loss at [phone] HZ then downsloping to severe SNHL at 2000 Hz on the left..  SRT: 60 dB left ear;   25 dB right ear WRS: 84% left ear;   100 % right ear  Assessment:  Cory Woodward presents today with  1. Bilateral hearing loss, unspecified hearing loss type   2. Tinnitus of both ears    Cory Woodward is a 52 year old male who presents with a long history of subjective hearing loss and tinnitus - symptoms are worse in the left ear.  Audiometric evaluation demonstrates normal downsloping to moderately severe sensorineural hearing loss in the right ear.  The left ear demonstrates moderate sensorineural hearing loss upsloping to mild at 500 to 1000 Hz and then downsloping to severe sensorineural hearing loss at  2000 Hz.  SRT's and word recognition are consistent with audiometric findings.    Plan:  Discussed findings with the patient and all questions answered.  Given asymmetric nature of hearing loss recommend MRI, IAC to rule out retrocochlear lesion.  Will follow-up on  results of MRI and contact patient to discuss.  If MRI is normal, patient is medically cleared for hearing aids.  Recommend repeat audiogram in 1 year.  Cory MICAEL Rily, MD Otolaryngology - Head & Neck Surgery ENT & Audiology - [facility]   Electronically signed by: Cory Elsie Rily, MD 01/02/2023 8:51 AM

## 2023-03-19 NOTE — Progress Notes (Signed)
 [facility]     Hearing Aid Consultation   Patient Name Cory Woodward  Patient DOB 26-Apr-1970  Patient Age 52 y.o.  Patient MRN [id number]    DEAGEN KRASS came to the clinic today for a Hearing Device Consultation. Gatsby has known asymmetric sensorineural hearing loss worse left ear, most recently evaluated on 01/02/2023 by our [facility] clinic. Patient reports being a new hearing aid user. Please refer to medical record for further relevant details PRN.   The Hearing Abilities Questionnaire was completed by pt today and responses may be viewed scanned under media tab. This is a questionnaire to assess prior hearing aid experience, factors of importance when pursuing hearing aids, readiness to pursue hearing aids, and different listening situations of difficulty.      Mihir was counseled on the potential benefits and limitations of amplification. Much of today's visit was spent discussing Terrace's lifestyle, common listening environments, degree of hearing loss, manual dexterity, and comfort with technology. Spent ample time discussing hearing aid styles, technology, costs/payment options (including insurance benefits/estimated out of pocket cost), trial period, warranty, loss and damage policy, future routine care maintenance, unbundled vs bundled service options, out of warranty repairs, lifespan of devices, and return policy.   Polk verbalized understanding of information provided today and requested to proceed w/ fitting of the devices listed below. Jari understands that the patient portion of cost is due in full at time of fitting (unless using [employer] payment plan).      Ear: Binaural  Style: Receiver in canal  Manufacturer:  Phonak  Model:  Slim L50-R  Color:  Graphite gray/black  Battery: Merchant navy officer Power: 63M  Coupling: Dome  Cost: (if [employer], this is an estimate) [phone] (pt paid consult fee today and EAA  deducted from cost due to equipment malfunction/awaiting replacement and thus cannot complete at this time)   PLAN:  Return to clinic in approximately 2-3 weeks for a hearing aid fitting.  Repeat audiologic evaluation should concerns regarding a change in hearing arise.  Practice good communication strategies, such as facing the speaker, and reducing distance and background noise when possible.  Hearing protection is recommended when exposed to loud noise.  Maurilio FURY Chesnut, Au.D., CCC-A Audiologist

## 2023-04-28 NOTE — Progress Notes (Addendum)
 [facility] Hearing Aid Follow-Up Note   Patient Name Cory Woodward  Patient DOB May 16, 1970  Patient Age 53 y.o.  Patient MRN [id number]    [name] was seen today for a hearing aid follow-up. Please see hearing aid specifications below. [name] has asymmetric sensorineural hearing loss worse left ear, most recently evaluated on 01/02/2023 by our [facility] office. [name] is a new hearing aid user. Please refer to medical record for further details as needed. Today, [name] reported he has overall done well with his devices. He inquires if he can have a smaller dome on right aid, noting it was more noticeable in his ear compared to left but not uncomfortable. He had some questions about aid function/sound quality. Pt has already paired his aids to his phone for streaming and app use on his own and enjoys these features. He had some minor questions about this today. [name] reports he feels ready to try a volume increase today.   Hearing Device Specifications:   Ear   Left Right  Style Receiver in canal Receiver in canal  Make [facility] 50-R  Serial Number [id number]  Color Graphite/black Graphite/black  Battery Rechargeable Rechargeable  Initial Fit Date 04/11/2023 04/11/2023  Trial Period Expiration 05/26/2023 05/26/2023  Warranty Expiration 04/19/2026 04/19/2026  Receiver/tubing M Receiver 5 1L M Receiver 5 1R  Coupling Small 4.0 Vented Dome Small 4.0 Vented Dome  Other Cerustop wax guards; no sports lock Cerustop wax guard; no sports lock    Aids in good condition w/ good sound quality; mild debris noted. Cleaned both and replaced wax guards on both. Changed right to a small vented dome and overall noted good retention. Pt reported this to be more comfortable and now he wants the same on left aid due to now can feel left aid more. Changed left aid to small vented dome as well and pt reported this to be more comfortable.  Encouraged him to watch physical fit and let me know if he is having retention issues w/ smaller dome. Sent 2 medium vented domes home w/ pt per his request in the event he wants to change back to them should any retention issues arise w/ smalls. [employer] logistics/support/administrative officer today. Decreased occlusion compensation to medium. Increased overall gain to 85%. Pt reported these adjustments to be good, clear, and comfortable. Counseled him on use of aids and phone connectivity PRN. Answered pt's questions today. Discussed follow-up plan from here and pt was in agreement.  PLAN: Full time use of hearing aids Hearing aid follow-up appointment in 3 weeks prior to trial period expiration, or sooner as needed Repeat audiologic evaluation should concerns regarding a change in hearing arise, or as otherwise previously recommended Practice good communication strategies, such as facing the speaker, and reducing distance and background noise when possible Use of hearing protection around all hazardous noise  Emma B. Chesnut, Au.D., CCC-A Audiologist

## 2023-05-28 NOTE — Progress Notes (Signed)
 [facility] Hearing Aid Follow-Up Note   Patient Name Cory Woodward  Patient DOB 1970-09-30  Patient Age 53 y.o.  Patient MRN [id number]    [name] was seen today for a final hearing aid follow-up within trial period (ends today, slightly extended due to provider unexpectedly out of office recently). Please see hearing aid specifications below. [name] has asymmetric sensorineural hearing loss worse left ear, most recently evaluated on 01/02/2023 by our [facility] office. Please refer to medical record for further details as needed. Today, [name] reported he continues to do well with his aids and denied any needs for them today. He feels confident in cleaning procedures. [name] denied any concerns w/ phone use w/ aids. He reported being ready for gain increase today and feels like he is ready to go up to full prescription. [name] denied any new ear concerns today.   Hearing Device Specifications:   Ear   Left Right  Style Receiver in canal Receiver in canal  Make [facility] 50-R  Serial Number [id number]  Color Graphite/black Graphite/black  Battery Rechargeable Rechargeable  Initial Fit Date 04/11/2023 04/11/2023  Trial Period Expiration 06/02/2023 06/02/2023  Warranty Expiration 04/19/2026 04/19/2026  Receiver/tubing M Receiver 5 1L M Receiver 5 1R  Coupling Small 4.0 Vented Dome Small 4.0 Vented Dome  Other Cerustop wax guards; no sports lock Cerustop wax guard; no sports lock    Both aids in overall good condition w/ mild debris. Cleaned and replaced wax guards/domes. Good sound quality from both. Increased gain to 100% and turned off occlusion compensation. Provided some extra domes as a courtesy today. Discussed trial expiration today. Discussed follow-up plan from here and pt was in agreement.  PLAN: Full time use of hearing aids Hearing aid follow-up appointment in 6 mos, or sooner as needed Repeat  audiologic evaluation should concerns regarding a change in hearing arise, or as otherwise previously recommended Practice good communication strategies, such as facing the speaker, and reducing distance and background noise when possible Use of hearing protection around all hazardous noise  Emma B. Chesnut, Au.D., CCC-A Audiologist

## 2023-10-27 LAB — COLOGUARD: COLOGUARD: POSITIVE — AB

## 2023-12-24 NOTE — Progress Notes (Signed)
 [facility] Hearing Aid Follow-Up Note   Patient Name Cory Woodward  Patient DOB 1970-09-13  Patient Age 53 y.o.  Patient MRN [id number]    Christepher Melchior was seen today for a routine hearing aid follow-up. Please see hearing aid specifications below. Leeland has asymmetric sensorineural hearing loss worse left ear, most recently evaluated on 01/02/2023 by our [facility] office. Please refer to medical record for further details as needed. Today, Fraser reported he continues to do well with his aids overall. He has noted left aid or ear sounds weaker and thinks his hearing may have changed. He denied any other significant changes in otologic hx. He notes he bought wax guards and domes online due to running out and named them as being a different product than designed for his aids. States after he put one of those wax guards in he noted a bunch of static. Pt notes that his old phone and now his new phone tend to intermittently disconnect from his hearing aids for bluetooth or not hold settings he changes in app. He notes that his previous phone was older and would no longer complete phone software updates. His new phone was found today to have 2 pending updates. Pt was unaware of how to manually check for updates, stating he just has automatic updates turned on and lets it run whenever he gets a notification.   Hearing Device Specifications:   Ear   Left Right  Style Receiver in canal Receiver in canal  Make [facility] [place] 50-R  Serial Number [id number]  Color Graphite/black Graphite/black  Battery Rechargeable Rechargeable  Initial Fit Date 04/11/2023 04/11/2023  Warranty Expiration 04/19/2026 04/19/2026  Receiver/tubing M Receiver 5 1L M Receiver 5 1R  Coupling Small 4.0 Vented Dome Small 4.0 Vented Dome  Other Cerustop wax guards; no sports lock Cerustop wax guard; no sports lock    Otoscopy unremarkable bilaterally. Both aids  in overall dirty condition around domes and some mild debris in wax guards/mics. Incorrect wax guards in aid and were difficult to get out, but ultimately able to be removed. Cleaned both aids and replaced domes/wax guards. Good sound quality noted following and pt noted an improvement in sound quality/better balance between aids. Provided some extra supplies as a courtesy today and counseled pt on appropriate care/maintenance PRN. Counseled pt on phone updates and how this affects hearing aid connectivity. Encouraged him to let me know if hearing aids keep acting up w/ phone after he completes updates and also to let me know if he continues to note any decreased hearing in that left ear now that his hearing aids are cleaned/sounding better and we can recheck his hearing if so. Discussed routine follow-up options from here and pt requested 1 year follow-up hearing aid appt.  PLAN: Full time use of hearing aids Routine hearing aid follow-up appointment in 1 year per pt request, or sooner as needed Repeat audiologic evaluation should concerns regarding a change in hearing arise, or as otherwise previously recommended Practice good communication strategies, such as facing the speaker, and reducing distance and background noise when possible Use of hearing protection around all hazardous noise  Emma B. Chesnut, Au.D., CCC-A Audiologist

## 2024-01-05 ENCOUNTER — Other Ambulatory Visit: Payer: Self-pay

## 2024-01-05 ENCOUNTER — Emergency Department (HOSPITAL_BASED_OUTPATIENT_CLINIC_OR_DEPARTMENT_OTHER)

## 2024-01-05 ENCOUNTER — Encounter (HOSPITAL_BASED_OUTPATIENT_CLINIC_OR_DEPARTMENT_OTHER): Payer: Self-pay

## 2024-01-05 ENCOUNTER — Emergency Department (HOSPITAL_BASED_OUTPATIENT_CLINIC_OR_DEPARTMENT_OTHER)
Admission: EM | Admit: 2024-01-05 | Discharge: 2024-01-05 | Disposition: A | Discharge status: Home / Self Care | Attending: Emergency Medicine | Admitting: Emergency Medicine

## 2024-01-05 DIAGNOSIS — R110 Nausea: Secondary | ICD-10-CM | POA: Diagnosis not present

## 2024-01-05 DIAGNOSIS — I10 Essential (primary) hypertension: Secondary | ICD-10-CM | POA: Insufficient documentation

## 2024-01-05 DIAGNOSIS — R42 Dizziness and giddiness: Secondary | ICD-10-CM | POA: Insufficient documentation

## 2024-01-05 DIAGNOSIS — Z91010 Allergy to peanuts: Secondary | ICD-10-CM | POA: Insufficient documentation

## 2024-01-05 DIAGNOSIS — Z7982 Long term (current) use of aspirin: Secondary | ICD-10-CM | POA: Insufficient documentation

## 2024-01-05 DIAGNOSIS — Z79899 Other long term (current) drug therapy: Secondary | ICD-10-CM | POA: Insufficient documentation

## 2024-01-05 DIAGNOSIS — H938X2 Other specified disorders of left ear: Secondary | ICD-10-CM | POA: Insufficient documentation

## 2024-01-05 LAB — COMPREHENSIVE METABOLIC PANEL WITH GFR
ALT: 29 U/L (ref 0–44)
AST: 19 U/L (ref 15–41)
Albumin: 4.6 g/dL (ref 3.5–5.0)
Alkaline Phosphatase: 66 U/L (ref 38–126)
Anion gap: 14 (ref 5–15)
BUN: 14 mg/dL (ref 6–20)
CO2: 24 mmol/L (ref 22–32)
Calcium: 9.6 mg/dL (ref 8.9–10.3)
Chloride: 101 mmol/L (ref 98–111)
Creatinine, Ser: 0.9 mg/dL (ref 0.61–1.24)
GFR, Estimated: >60 mL/min (ref >60)
Glucose, Bld: 129 mg/dL — ABNORMAL HIGH (ref 70–99)
Potassium: 3.7 mmol/L (ref 3.5–5.1)
Sodium: 138 mmol/L (ref 135–145)
Total Bilirubin: 0.5 mg/dL (ref 0.0–1.2)
Total Protein: 7.4 g/dL (ref 6.5–8.1)

## 2024-01-05 LAB — TROPONIN T, HIGH SENSITIVITY: Troponin T High Sensitivity: <15 ng/L (ref 0–19)

## 2024-01-05 LAB — CBC
HCT: 50.7 % (ref 39.0–52.0)
Hemoglobin: 17.3 g/dL — ABNORMAL HIGH (ref 13.0–17.0)
MCH: 30.8 pg (ref 26.0–34.0)
MCHC: 34.1 g/dL (ref 30.0–36.0)
MCV: 90.2 fL (ref 80.0–100.0)
Platelets: 219 K/uL (ref 150–400)
RBC: 5.62 MIL/uL (ref 4.22–5.81)
RDW: 12.1 % (ref 11.5–15.5)
WBC: 9.6 K/uL (ref 4.0–10.5)
nRBC: 0 % (ref 0.0–0.2)

## 2024-01-05 MED ORDER — MECLIZINE HCL 25 MG PO TABS: 25.0000 mg | ORAL_TABLET | Freq: Three times a day (TID) | ORAL | 0 refills | Status: AC | PRN

## 2024-01-05 MED ORDER — FLUNISOLIDE 25 MCG/ACT (0.025%) NA SOLN: 2.0000 | Freq: Every day | NASAL | 1 refills | Status: AC

## 2024-01-05 NOTE — ED Provider Notes (Signed)
 [place] [facility] Provider Note   CSN: [id number] Arrival date & time: 01/05/24  1137     Patient presents with: Dizziness and Ear Fullness   Cory Woodward is a 53 y.o. male presenting for sudden onset of dizziness that began this morning, had some associated nausea but no vomiting.  He also endorses left ear fullness, intermittent sensation of cracking as well as pressure relief in the left ear.  Denies have any nasal congestion or sore throat.  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  He has a previous medical history of peripheral vascular disease, peripheral arterial disease, and hyperlipidemia.  Does also have history of essential hypertension for which he takes medications.    Dizziness Ear Fullness       Prior to Admission medications   Medication Sig Start Date End Date Taking? Authorizing Provider  flunisolide (NASALIDE) 25 MCG/ACT (0.025%) SOLN Place 2 sprays into the nose daily. 01/05/24  Yes Cory Dorn BROCKS, PA  meclizine (ANTIVERT) 25 MG tablet Take 1 tablet (25 mg total) by mouth 3 (three) times daily as needed for dizziness. 01/05/24  Yes Cory Dorn BROCKS, PA  aspirin  81 MG chewable tablet Chew 1 tablet (81 mg total) by mouth daily. 02/12/15   Cory Maurilio HERO, PA-C  atorvastatin  (LIPITOR) 40 MG tablet Take 1 tablet (40 mg total) by mouth daily. 04/27/15   Cory Deatrice LABOR, MD  losartan -hydrochlorothiazide  (HYZAAR) 100-25 MG tablet Take 1 tablet by mouth daily.    [provider]    Allergies: Peanut-containing drug products    Review of Systems  Neurological:  Positive for dizziness.  All other systems reviewed and are negative.   Updated Vital Signs BP (!) 168/101 (BP Location: Left Arm)   Pulse 62   Temp 97.6 F (36.4 C) (Oral)   Resp 16   Ht 6' 1  (1.854 m)   Wt 108.9 kg   SpO2 100%   BMI 31.66 kg/m   Physical Exam Vitals and nursing note reviewed.  Constitutional:      General: He is not in acute distress.    Appearance: Normal appearance.  HENT:     Head: Normocephalic and atraumatic.     Right Ear: Tympanic membrane, ear canal and external ear normal.     Left Ear: Tympanic membrane, ear canal and external ear normal.     Ears:     Comments: Wears hearing aids at baseline, bilateral sensorineural hearing loss previously noted.    Mouth/Throat:     Mouth: Mucous membranes are moist.     Pharynx: Oropharynx is clear.  Eyes:     Extraocular Movements: Extraocular movements intact.     Conjunctiva/sclera: Conjunctivae normal.     Pupils: Pupils are equal, round, and reactive to light.  Cardiovascular:     Rate and Rhythm: Normal rate and regular rhythm.     Pulses: Normal pulses.     Heart sounds: Normal heart sounds. No murmur heard.    No friction rub. No gallop.  Pulmonary:     Effort: Pulmonary effort is normal.     Breath sounds: Normal breath sounds.  Abdominal:     General: Abdomen is flat. Bowel sounds are normal.     Palpations: Abdomen is soft.  Musculoskeletal:  General: Normal range of motion.     Cervical back: Normal range of motion and neck supple.     Right lower leg: No edema.     Left lower leg: No edema.  Skin:    General: Skin is warm and dry.     Capillary Refill: Capillary refill takes less than 2 seconds.  Neurological:     General: No focal deficit present.     Mental Status: He is alert and oriented to person, place, and time. Mental status is at baseline.     GCS: GCS eye subscore is 4. GCS verbal subscore is 5. GCS motor subscore is 6.     Cranial Nerves: Cranial nerves 2-12 are intact.     Sensory: Sensation is intact.     Motor: Motor function is intact.     Coordination: Coordination is intact.     Gait: Gait is intact.     Comments: Ambulates without assistance using a  normal gait.  Psychiatric:        Mood and Affect: Mood normal.     (all labs ordered are listed, but only abnormal results are displayed) Labs Reviewed  COMPREHENSIVE METABOLIC PANEL WITH GFR - Abnormal; Notable for the following components:      Result Value   Glucose, Bld 129 (*)    All other components within normal limits  CBC - Abnormal; Notable for the following components:   Hemoglobin 17.3 (*)    All other components within normal limits  TROPONIN T, HIGH SENSITIVITY    EKG: None  Radiology: CT Head Wo Contrast Result Date: 01/05/2024 CLINICAL DATA:  Provided history: Vertigo, peripheral. Additional history provided: Dizziness. EXAM: CT HEAD WITHOUT CONTRAST TECHNIQUE: Contiguous axial images were obtained from the base of the skull through the vertex without intravenous contrast. RADIATION DOSE REDUCTION: This exam was performed according to the departmental dose-optimization program which includes automated exposure control, adjustment of the mA and/or kV according to patient size and/or use of iterative reconstruction technique. COMPARISON:  Brain MRI 01/31/2023. FINDINGS: Brain: Cerebral volume is normal. Mildly low-lying cerebellar tonsils, unchanged. There is no acute intracranial hemorrhage. No demarcated cortical infarct. No extra-axial fluid collection. No evidence of an intracranial mass. No midline shift. Vascular: No hyperdense vessel.  Atherosclerotic calcifications. Skull: No calvarial fracture or aggressive osseous lesion. Sinuses/Orbits: No mass or acute finding within the imaged orbits. No significant paranasal sinus disease at the imaged levels. IMPRESSION: No evidence of an acute intracranial abnormality. Electronically Signed   By: Cory Woodward D.O.   On: 01/05/2024 14:09     Procedures   Medications Ordered in the ED - No data to display                                  Medical Decision Making Amount and/or Complexity of Data Reviewed Labs:  ordered. Radiology: ordered.  Risk Prescription drug management.   Medical Decision Making:   [name] is a 53 y.o. male who presented to the ED today with dizziness detailed above.    External chart has been reviewed including previous imaging as well as previous audiology and ENT reports.. Complete initial physical exam performed, notably the patient  was alert and oriented in no apparent distress.  Physical exam is largely unremarkable, bilateral TMs appear normal, posterior oropharynx is unremarkable, and he is ambulatory with a normal gait..    Reviewed and confirmed nursing documentation  for past medical history, family history, social history.    Initial Assessment:   With the patient's presentation of dizziness and ear fullness, most likely diagnosis is left-sided eustachian tube dysfunction. Other diagnoses were considered including (but not limited to) Mnire's, vestibular neuritis, vestibular/acoustic neuroma.  As onset is sudden and not insidious believe that he is other causes of peripheral vertigo are less than likely, given the sudden onset of symptoms, more consistent with possible eustachian tube dysfunction and/or neuritis.  He does not have any other signs or symptoms of viral illness at this time making neuritis less likely.  These are considered less likely due to history of present illness and physical exam findings.     Initial Plan:  At patient request, along with shared decision making with the patient, ordered a CT of the head to evaluate for intracranial pathology. Screening labs including CBC and Metabolic panel to evaluate for infectious or metabolic etiology of disease.   EKG and single troponin to evaluate for cardiac pathology. Single troponin appropriate due to greater than 6 hours since maximal intensity of symptoms. Objective evaluation as below reviewed   Initial Study Results:   Laboratory  All laboratory results reviewed without evidence of  clinically relevant pathology.   Exceptions include: Hemoglobin is 17.3.  EKG EKG was reviewed independently. Rate, rhythm, axis, intervals all examined and without medically relevant abnormality. ST segments without concerns for elevations.    Radiology:  All images reviewed independently. Agree with radiology report at this time.   CT Head Wo Contrast Result Date: 01/05/2024 CLINICAL DATA:  Provided history: Vertigo, peripheral. Additional history provided: Dizziness. EXAM: CT HEAD WITHOUT CONTRAST TECHNIQUE: Contiguous axial images were obtained from the base of the skull through the vertex without intravenous contrast. RADIATION DOSE REDUCTION: This exam was performed according to the departmental dose-optimization program which includes automated exposure control, adjustment of the mA and/or kV according to patient size and/or use of iterative reconstruction technique. COMPARISON:  Brain MRI 01/31/2023. FINDINGS: Brain: Cerebral volume is normal. Mildly low-lying cerebellar tonsils, unchanged. There is no acute intracranial hemorrhage. No demarcated cortical infarct. No extra-axial fluid collection. No evidence of an intracranial mass. No midline shift. Vascular: No hyperdense vessel.  Atherosclerotic calcifications. Skull: No calvarial fracture or aggressive osseous lesion. Sinuses/Orbits: No mass or acute finding within the imaged orbits. No significant paranasal sinus disease at the imaged levels. IMPRESSION: No evidence of an acute intracranial abnormality. Electronically Signed   By: Cory Woodward D.O.   On: 01/05/2024 14:09     Reassessment and Plan:   Physical exam is reassuring, cardiac auscultation is unremarkable with no appreciated murmurs normal S1-S2, there is normal findings of the ears, with normal appearing TMs without any effusion or infection noted.  Posterior oropharynx is unremarkable.  He is ambulatory without assistance with normal gait.  The CT scan did not show any acute  findings.  Given the reassuring workup and largely unremarkable physical neurologic exam, believe this is possibly secondary to eustachian tube dysfunction of the left ear as he has had some increased pressure along with popping cracking sensation in the ear.  Will manage this with fluticasone nasal spray as well as give him a course of meclizine as needed for ongoing dizziness.  Encouraged him to follow-up with his primary care also provide referral for ENT for continued evaluation of his dizziness and known sensorineural hearing loss and possible eustachian tube dysfunction.  Patient understands and agrees to the care plan, has no further concerns  at this time.  His vital signs been stable and workup and physical exam are reassuring, findings stable for discharge at this time.       Final diagnoses:  Dizziness    ED Discharge Orders          Ordered    flunisolide (NASALIDE) 25 MCG/ACT (0.025%) SOLN  Daily        01/05/24 1358    meclizine (ANTIVERT) 25 MG tablet  3 times daily PRN        01/05/24 1358               Cory Woodward, [place] 01/05/24 1417    Geraldene Hamilton, MD 01/06/24 2227

## 2024-01-05 NOTE — ED Triage Notes (Signed)
 Pt reports dizziness when getting out of bed this morning. Dizziness has improved but is not gone. Pt reports nausea around 8-9am that has resolved. Pt reports echo feeling and fullness in left ear. Denies CP/SOB. Denies cardiac history. NAD noted in triage.

## 2024-01-05 NOTE — Discharge Instructions (Addendum)
 As we discussed, follow-up with your primary care as well as consider following with ENT clinic for further evaluation.  You can use the meclizine as needed for ongoing symptoms, regarding the use of the fluticasone as noted in our conversation this may take 1 to 2 weeks to maximize effect, continue using this regularly to ensure maximal benefit.
